# Patient Record
Sex: Male | Born: 1985
Health system: Southern US, Community
[De-identification: ages and names within clinical notes are randomized; demographics above are authoritative.]

## PROBLEM LIST (undated history)

## (undated) DIAGNOSIS — F329 Major depressive disorder, single episode, unspecified: Secondary | ICD-10-CM

## (undated) DIAGNOSIS — F32A Depression, unspecified: Secondary | ICD-10-CM

## (undated) DIAGNOSIS — K219 Gastro-esophageal reflux disease without esophagitis: Secondary | ICD-10-CM

## (undated) DIAGNOSIS — F419 Anxiety disorder, unspecified: Secondary | ICD-10-CM

## (undated) DIAGNOSIS — G709 Myoneural disorder, unspecified: Secondary | ICD-10-CM

## (undated) DIAGNOSIS — M109 Gout, unspecified: Secondary | ICD-10-CM

## (undated) HISTORY — DX: Anxiety disorder, unspecified: F41.9

## (undated) HISTORY — PX: SMALL INTESTINE SURGERY: SHX150

## (undated) HISTORY — DX: Gout, unspecified: M10.9

## (undated) HISTORY — DX: Depression, unspecified: F32.A

## (undated) HISTORY — DX: Myoneural disorder, unspecified: G70.9

---
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
## 2013-12-01 ENCOUNTER — Emergency Department: Payer: Self-pay | Admitting: Emergency Medicine

## 2017-10-13 ENCOUNTER — Other Ambulatory Visit: Payer: Self-pay

## 2017-10-13 ENCOUNTER — Emergency Department
Admission: EM | Admit: 2017-10-13 | Discharge: 2017-10-13 | Disposition: A | Discharge status: Home / Self Care | Payer: Self-pay | Attending: Emergency Medicine | Admitting: Emergency Medicine

## 2017-10-13 ENCOUNTER — Emergency Department: Payer: Self-pay

## 2017-10-13 ENCOUNTER — Encounter: Payer: Self-pay | Admitting: Emergency Medicine

## 2017-10-13 DIAGNOSIS — Z5321 Procedure and treatment not carried out due to patient leaving prior to being seen by health care provider: Secondary | ICD-10-CM | POA: Insufficient documentation

## 2017-10-13 DIAGNOSIS — R079 Chest pain, unspecified: Secondary | ICD-10-CM | POA: Insufficient documentation

## 2017-10-13 LAB — BASIC METABOLIC PANEL
Anion gap: 6 (ref 5–15)
BUN: 10 mg/dL (ref 6–20)
CALCIUM: 9.2 mg/dL (ref 8.9–10.3)
CO2: 29 mmol/L (ref 22–32)
CREATININE: 1.4 mg/dL — AB (ref 0.61–1.24)
Chloride: 103 mmol/L (ref 98–111)
GFR calc non Af Amer: >60 mL/min (ref >60)
Glucose, Bld: 86 mg/dL (ref 70–99)
Potassium: 4.2 mmol/L (ref 3.5–5.1)
SODIUM: 138 mmol/L (ref 135–145)

## 2017-10-13 LAB — CBC
HCT: 49.3 % (ref 40.0–52.0)
Hemoglobin: 17.1 g/dL (ref 13.0–18.0)
MCH: 29.4 pg (ref 26.0–34.0)
MCHC: 34.6 g/dL (ref 32.0–36.0)
MCV: 85 fL (ref 80.0–100.0)
PLATELETS: 258 10*3/uL (ref 150–440)
RBC: 5.8 MIL/uL (ref 4.40–5.90)
RDW: 13.4 % (ref 11.5–14.5)
WBC: 8.9 10*3/uL (ref 3.8–10.6)

## 2017-10-13 LAB — TROPONIN I

## 2017-10-13 NOTE — ED Notes (Signed)
Patient states his boss is texting him and wants him to go back to work.  Patient states he is going to leave.  Patient encouraged to return to the ED or go to a PCP or urgent care if symptoms continue or get worse.  Patient given booklet of [place] reduced rate medical resources.  Patient verbalized understanding.

## 2017-10-13 NOTE — ED Triage Notes (Signed)
Pt to ED via POV c/o chest pain x 3-4 days. Pt states that the pain is located in the center of his chest and is non-radiating. Pt states that he has had some shortness of breath. Pt denies any other symptoms. Pt is in NAD at this time.

## 2017-10-14 ENCOUNTER — Other Ambulatory Visit: Payer: Self-pay

## 2017-10-14 ENCOUNTER — Emergency Department: Payer: Self-pay

## 2017-10-14 ENCOUNTER — Encounter: Payer: Self-pay | Admitting: Emergency Medicine

## 2017-10-14 DIAGNOSIS — F17200 Nicotine dependence, unspecified, uncomplicated: Secondary | ICD-10-CM | POA: Insufficient documentation

## 2017-10-14 DIAGNOSIS — K219 Gastro-esophageal reflux disease without esophagitis: Secondary | ICD-10-CM | POA: Insufficient documentation

## 2017-10-14 LAB — CBC
HEMATOCRIT: 46.6 % (ref 40.0–52.0)
HEMOGLOBIN: 16.1 g/dL (ref 13.0–18.0)
MCH: 29.4 pg (ref 26.0–34.0)
MCHC: 34.5 g/dL (ref 32.0–36.0)
MCV: 85.1 fL (ref 80.0–100.0)
Platelets: 235 10*3/uL (ref 150–440)
RBC: 5.47 MIL/uL (ref 4.40–5.90)
RDW: 13.1 % (ref 11.5–14.5)
WBC: 8.5 10*3/uL (ref 3.8–10.6)

## 2017-10-14 LAB — BASIC METABOLIC PANEL
ANION GAP: 4 — AB (ref 5–15)
BUN: 13 mg/dL (ref 6–20)
CO2: 29 mmol/L (ref 22–32)
Calcium: 9 mg/dL (ref 8.9–10.3)
Chloride: 102 mmol/L (ref 98–111)
Creatinine, Ser: 1.6 mg/dL — ABNORMAL HIGH (ref 0.61–1.24)
GFR calc Af Amer: >60 mL/min (ref >60)
GFR calc non Af Amer: 56 mL/min — ABNORMAL LOW (ref >60)
GLUCOSE: 160 mg/dL — AB (ref 70–99)
Potassium: 3.5 mmol/L (ref 3.5–5.1)
Sodium: 135 mmol/L (ref 135–145)

## 2017-10-14 LAB — TROPONIN I: Troponin I: <0.03 ng/mL (ref <0.03)

## 2017-10-14 NOTE — ED Triage Notes (Addendum)
Patient with complaint of central chest pain times 4-5 days. Patient states that the pain is constant but that it hurts worse with eating or burping. Patient also with complaint of rash to left ac times 6 months.

## 2017-10-15 ENCOUNTER — Emergency Department
Admission: EM | Admit: 2017-10-15 | Discharge: 2017-10-15 | Disposition: A | Discharge status: Home / Self Care | Payer: Self-pay | Attending: Emergency Medicine | Admitting: Emergency Medicine

## 2017-10-15 DIAGNOSIS — K219 Gastro-esophageal reflux disease without esophagitis: Secondary | ICD-10-CM

## 2017-10-15 MED ORDER — PANTOPRAZOLE SODIUM 40 MG PO TBEC
DELAYED_RELEASE_TABLET | ORAL | Status: AC
  Filled 2017-10-15: qty 1

## 2017-10-15 MED ORDER — GI COCKTAIL ~~LOC~~
30.0000 mL | Freq: Once | ORAL | Status: AC
  Administered 2017-10-15: 30 mL via ORAL
  Filled 2017-10-15: qty 30

## 2017-10-15 MED ORDER — PANTOPRAZOLE SODIUM 40 MG PO TBEC: 40.0000 mg | DELAYED_RELEASE_TABLET | Freq: Every day | ORAL | 0 refills | Status: DC

## 2017-10-15 MED ORDER — PANTOPRAZOLE SODIUM 40 MG PO TBEC
40.0000 mg | DELAYED_RELEASE_TABLET | Freq: Every day | ORAL | Status: DC
  Administered 2017-10-15: 40 mg via ORAL

## 2017-10-15 NOTE — ED Provider Notes (Signed)
[facility] Emergency Department Provider Note    First MD Initiated Contact with Patient 10/15/17 [phone]     (approximate)  I have reviewed the triage vital signs and the nursing notes.   HISTORY  Chief Complaint Chest Pain    HPI Cody Delgado is a 32 y.o. male presents to the emergency department with 5-day history of epigastric/mid chest discomfort with eating.  Patient denies any fever.  Patient denies any dyspnea or cough.  Patient denies any lower extremity pain or swelling no history of DVT or PE.   Past medical history None There are no active problems to display for this patient.   Past surgical history None  Prior to Admission medications   Not on File    Allergies No known drug allergies No family history on file.  Social History Social History   Tobacco Use  . Smoking status: Current Every Day Smoker  . Smokeless tobacco: Never Used  Substance Use Topics  . Alcohol use: Yes    Alcohol/week: 20.0 standard drinks    Types: 20 Cans of beer per week    Frequency: Never    Comment: 20 beers per week  . Drug use: Yes    Types: Marijuana    Review of Systems Constitutional: No fever/chills Eyes: No visual changes. ENT: No sore throat. Cardiovascular: Positive for chest pain. Respiratory: Denies shortness of breath. Gastrointestinal: No abdominal pain.  No nausea, no vomiting.  No diarrhea.  No constipation. Genitourinary: Negative for dysuria. Musculoskeletal: Negative for neck pain.  Negative for back pain. Integumentary: Negative for rash. Neurological: Negative for headaches, focal weakness or numbness.   ____________________________________________   PHYSICAL EXAM:  VITAL SIGNS: ED Triage Vitals  Enc Vitals Group     BP 10/14/17 2304 (!) 146/101     Pulse Rate 10/14/17 2304 93     Resp 10/14/17 2304 18     Temp 10/14/17 2304 98.5 F (36.9 C)     Temp Source 10/14/17 2304 Oral     SpO2 10/14/17 2304  99 %     Weight 10/14/17 2305 104.3 kg (230 lb)     Height 10/14/17 2305 1.854 m (6\' 1" )     Head Circumference --      Peak Flow --      Pain Score 10/14/17 2305 2     Pain Loc --      Pain Edu? --      Excl. in GC? --     Constitutional: Alert and oriented. Well appearing and in no acute distress. Eyes: Conjunctivae are normal.  Head: Atraumatic. Mouth/Throat: Mucous membranes are moist.  Oropharynx non-erythematous. Neck: No stridor.   Cardiovascular: Normal rate, regular rhythm. Good peripheral circulation. Grossly normal heart sounds. Respiratory: Normal respiratory effort.  No retractions. Lungs CTAB. Gastrointestinal: Soft and nontender. No distention.  Musculoskeletal: No lower extremity tenderness nor edema. No gross deformities of extremities. Neurologic:  Normal speech and language. No gross focal neurologic deficits are appreciated.  Skin:  Skin is warm, dry and intact. No rash noted. Psychiatric: Mood and affect are normal. Speech and behavior are normal.   LABS (all labs ordered are listed, but only abnormal results are displayed)  Labs Reviewed  BASIC METABOLIC PANEL - Abnormal; Notable for the following components:      Result Value   Glucose, Bld 160 (*)    Creatinine, Ser 1.60 (*)    GFR calc non Af Amer 56 (*)    Anion  gap 4 (*)    All other components within normal limits  CBC  TROPONIN I   ____________________________________________  EKG  ED ECG REPORT I, Phil Campbell N BROWN, the attending physician, personally viewed and interpreted this ECG.   Date: 10/15/2017  EKG Time: 10:55 PM  Rate: 78  Rhythm: Normal sinus rhythm  Axis: Normal  Intervals: Normal  ST&T Change: None  ____________________________________________  RADIOLOGY I, Jennings N BROWN, personally viewed and evaluated these images (plain radiographs) as part of my medical decision making, as well as reviewing the written report by the radiologist.  ED MD interpretation:     Official radiology report(s): Dg Chest 2 View  Result Date: 10/14/2017 CLINICAL DATA:  Central chest pain for 4-5 days, constant, worse with eating. EXAM: CHEST - 2 VIEW COMPARISON:  Chest x-ray dated 10/13/2017. FINDINGS: The heart size and mediastinal contours are within normal limits. Both lungs are clear. The visualized skeletal structures are unremarkable. IMPRESSION: Stable chest x-ray.  No active cardiopulmonary disease. Electronically Signed   By: Bary Richard M.D.   On: 10/14/2017 23:39      Procedures   ____________________________________________   INITIAL IMPRESSION / ASSESSMENT AND PLAN / ED COURSE  As part of my medical decision making, I reviewed the following data within the electronic MEDICAL RECORD NUMBER  32 year old male presented with above-stated history and physical exam secondary to midline chest and epigastric discomfort.History and physical exam consistent with GERD however consider the possibility of CAD and as such EKG was performed which revealed no evidence of ischemia or infarction.  Laboratory data unremarkable including troponin.  Patient given GI cocktail and Protonix in the emergency department with complete resolution of discomfort.  ____________________________________________  FINAL CLINICAL IMPRESSION(S) / ED DIAGNOSES  Final diagnoses:  Gastroesophageal reflux disease, esophagitis presence not specified     MEDICATIONS GIVEN DURING THIS VISIT:  Medications  gi cocktail (Maalox,Lidocaine,Donnatal) (has no administration in time range)  pantoprazole (PROTONIX) EC tablet 40 mg (has no administration in time range)     ED Discharge Orders    None       Note:  This document was prepared using Dragon voice recognition software and may include unintentional dictation errors.    Darci Current, MD 10/15/17 0230

## 2017-10-15 NOTE — ED Notes (Signed)
Pt updated on the wait time and thanked for his patience; understands he will be next for treatment room

## 2017-10-15 NOTE — ED Notes (Signed)
Pt waiting patiently for treatment room; provided with warm blanket 

## 2017-11-11 ENCOUNTER — Other Ambulatory Visit: Payer: Self-pay

## 2017-11-11 ENCOUNTER — Encounter: Payer: Self-pay | Admitting: Emergency Medicine

## 2017-11-11 DIAGNOSIS — J029 Acute pharyngitis, unspecified: Secondary | ICD-10-CM | POA: Insufficient documentation

## 2017-11-11 DIAGNOSIS — Z5321 Procedure and treatment not carried out due to patient leaving prior to being seen by health care provider: Secondary | ICD-10-CM | POA: Insufficient documentation

## 2017-11-11 DIAGNOSIS — R0981 Nasal congestion: Secondary | ICD-10-CM | POA: Insufficient documentation

## 2017-11-11 NOTE — ED Triage Notes (Signed)
Pt in via POV with complaints of sore throat and congestion x 3 days, taking mucinex without any relief.  Pt denies any fevers.  Vitals WDL, NAD noted at this time.

## 2017-11-12 ENCOUNTER — Emergency Department
Admission: EM | Admit: 2017-11-12 | Discharge: 2017-11-12 | Disposition: A | Discharge status: Home / Self Care | Payer: Self-pay | Attending: Emergency Medicine | Admitting: Emergency Medicine

## 2017-11-12 HISTORY — DX: Gastro-esophageal reflux disease without esophagitis: K21.9

## 2017-11-12 LAB — GROUP A STREP BY PCR: GROUP A STREP BY PCR: NOT DETECTED

## 2018-06-24 ENCOUNTER — Other Ambulatory Visit: Payer: Self-pay

## 2018-06-24 ENCOUNTER — Emergency Department: Payer: Medicaid Other

## 2018-06-24 ENCOUNTER — Encounter: Payer: Self-pay | Admitting: Emergency Medicine

## 2018-06-24 DIAGNOSIS — M79671 Pain in right foot: Secondary | ICD-10-CM | POA: Diagnosis present

## 2018-06-24 DIAGNOSIS — F17210 Nicotine dependence, cigarettes, uncomplicated: Secondary | ICD-10-CM | POA: Diagnosis not present

## 2018-06-24 DIAGNOSIS — M109 Gout, unspecified: Secondary | ICD-10-CM | POA: Diagnosis not present

## 2018-06-24 LAB — URIC ACID: Uric Acid, Serum: 10.5 mg/dL — ABNORMAL HIGH (ref 3.7–8.6)

## 2018-06-24 NOTE — ED Triage Notes (Addendum)
Patient ambulatory to triage with steady gait, without difficulty or distress noted; pt reports right foot pain with no known injury x 3-4 days; pt reports he is concerned that it may be gout; denies hx of such but st family hx of same; st pain/swelling began in great toe then spread to foot

## 2018-06-25 ENCOUNTER — Emergency Department
Admission: EM | Admit: 2018-06-25 | Discharge: 2018-06-25 | Disposition: A | Discharge status: Home / Self Care | Payer: Medicaid Other | Attending: Emergency Medicine | Admitting: Emergency Medicine

## 2018-06-25 DIAGNOSIS — M109 Gout, unspecified: Secondary | ICD-10-CM

## 2018-06-25 MED ORDER — NAPROXEN 500 MG PO TABS: 500.0000 mg | ORAL_TABLET | Freq: Two times a day (BID) | ORAL | 0 refills | Status: AC | PRN

## 2018-06-25 MED ORDER — INDOMETHACIN 50 MG PO CAPS
50.0000 mg | ORAL_CAPSULE | Freq: Once | ORAL | Status: AC
  Administered 2018-06-25: 50 mg via ORAL
  Filled 2018-06-25: qty 1

## 2018-06-25 MED ORDER — COLCHICINE 0.6 MG PO TABS
0.6000 mg | ORAL_TABLET | Freq: Once | ORAL | Status: AC
  Administered 2018-06-25: 0.6 mg via ORAL
  Filled 2018-06-25: qty 1

## 2018-06-25 NOTE — ED Provider Notes (Signed)
[facility] Emergency Department Provider Note ____________________________   First MD Initiated Contact with Patient 06/25/18 [phone]     (approximate)  I have reviewed the triage vital signs and the nursing notes.   HISTORY  Chief Complaint Foot Pain    HPI Cody Delgado is a 33 y.o. male presents to the emergency department with nontraumatic right great toe foot ankle pain times approximately 3 days.  Patient denies any fever.  Patient denies any other complaints.  Patient states strong family history of gout.     Past Medical History:  Diagnosis Date   Acid reflux     There are no active problems to display for this patient.   History reviewed. No pertinent surgical history.  Prior to Admission medications   Medication Sig Start Date End Date Taking? Authorizing Provider  naproxen (NAPROSYN) 500 MG tablet Take 1 tablet (500 mg total) by mouth 2 (two) times daily as needed. 06/25/18 06/25/19  Darci Current, MD  pantoprazole (PROTONIX) 40 MG tablet Take 1 tablet (40 mg total) by mouth daily. 10/15/17 11/14/17  Darci Current, MD    Allergies Patient has no known allergies.  No family history on file.  Social History Social History   Tobacco Use   Smoking status: Current Every Day Smoker    Packs/day: 1.00    Types: Cigarettes   Smokeless tobacco: Never Used  Substance Use Topics   Alcohol use: Yes    Alcohol/week: 20.0 standard drinks    Types: 20 Cans of beer per week    Frequency: Never    Comment: 20 beers per week   Drug use: Yes    Types: Marijuana    Review of Systems Constitutional: No fever/chills Eyes: No visual changes. ENT: No sore throat. Cardiovascular: Denies chest pain. Respiratory: Denies shortness of breath. Gastrointestinal: No abdominal pain.  No nausea, no vomiting.  No diarrhea.  No constipation. Genitourinary: Negative for dysuria. Musculoskeletal: Negative for neck pain.  Negative for  back pain.  Positive for right foot pain. Integumentary: Negative for rash. Neurological: Negative for headaches, focal weakness or numbness.  ____________________________________________   PHYSICAL EXAM:  VITAL SIGNS: ED Triage Vitals  Enc Vitals Group     BP 06/24/18 2326 (!) 129/91     Pulse Rate 06/24/18 2326 88     Resp 06/24/18 2326 20     Temp 06/24/18 2326 98.4 F (36.9 C)     Temp Source 06/24/18 2326 Oral     SpO2 06/24/18 2326 98 %     Weight 06/24/18 2301 104.3 kg (230 lb)     Height 06/24/18 2301 1.829 m (6')     Head Circumference --      Peak Flow --      Pain Score 06/24/18 2301 8     Pain Loc --      Pain Edu? --      Excl. in GC? --     Constitutional: Alert and oriented. Well appearing and in no acute distress. Eyes: Conjunctivae are normal. Mouth/Throat: Mucous membranes are moist.  Oropharynx non-erythematous. Neck: No stridor.   Cardiovascular: Normal rate, regular rhythm. Good peripheral circulation. Grossly normal heart sounds. Respiratory: Normal respiratory effort.  No retractions. No audible wheezing. Gastrointestinal: Soft and nontender. No distention.  Musculoskeletal: No lower extremity tenderness nor edema. No gross deformities of extremities. Neurologic:  Normal speech and language. No gross focal neurologic deficits are appreciated.  Skin:  Skin is warm, dry and  intact. No rash noted. Psychiatric: Mood and affect are normal. Speech and behavior are normal.  ____________________________________________   LABS (all labs ordered are listed, but only abnormal results are displayed)  Labs Reviewed  URIC ACID - Abnormal; Notable for the following components:      Result Value   Uric Acid, Serum 10.5 (*)    All other components within normal limits   ___________________________________ RADIOLOGY I, [place] N Aidin Doane, personally viewed and evaluated these images (plain radiographs) as part of my medical decision making, as well as  reviewing the written report by the radiologist.  ED MD interpretation: The right foot x-ray per radiologist.  Official radiology report(s): Dg Foot Complete Right  Result Date: 06/24/2018 CLINICAL DATA:  Right foot pain.  No known injury. EXAM: RIGHT FOOT COMPLETE - 3+ VIEW COMPARISON:  None. FINDINGS: There is no evidence of fracture or dislocation. There is no evidence of arthropathy or other focal bone abnormality. Soft tissues are unremarkable. IMPRESSION: Negative. Electronically Signed   By: Charlett NoseKevin  Dover M.D.   On: 06/24/2018 23:26      Procedures   ____________________________________________   INITIAL IMPRESSION / MDM / ASSESSMENT AND PLAN / ED COURSE  As part of my medical decision making, I reviewed the following data within the electronic MEDICAL RECORD NUMBER   33 year old male presenting with above-stated history and physical exam concerning for possible gout.  Patient's uric acid level noted to be elevated at 10.5.  X-ray revealed no acute findings.  Patient given indomethacin and colchicine in the emergency department with pain provement.  Patient will be prescribed naproxen for home        ____________________________________________  FINAL CLINICAL IMPRESSION(S) / ED DIAGNOSES  Final diagnoses:  Acute gout involving toe of right foot, unspecified cause     MEDICATIONS GIVEN DURING THIS VISIT:  Medications  colchicine tablet 0.6 mg (has no administration in time range)  indomethacin (INDOCIN) capsule 50 mg (has no administration in time range)     ED Discharge Orders         Ordered    naproxen (NAPROSYN) 500 MG tablet  2 times daily PRN     06/25/18 0155           Note:  This document was prepared using Dragon voice recognition software and may include unintentional dictation errors.   Darci CurrentBrown, Kiln N, MD 06/25/18 [phone]

## 2019-09-02 ENCOUNTER — Telehealth: Payer: Self-pay

## 2019-09-02 ENCOUNTER — Ambulatory Visit
Admission: RE | Admit: 2019-09-02 | Discharge: 2019-09-02 | Disposition: A | Discharge status: Home / Self Care | Payer: Medicaid Other | Attending: Adult Health | Admitting: Adult Health

## 2019-09-02 ENCOUNTER — Ambulatory Visit (INDEPENDENT_AMBULATORY_CARE_PROVIDER_SITE_OTHER): Payer: Medicaid Other | Admitting: Adult Health

## 2019-09-02 ENCOUNTER — Other Ambulatory Visit: Payer: Self-pay

## 2019-09-02 ENCOUNTER — Encounter: Payer: Self-pay | Admitting: Adult Health

## 2019-09-02 ENCOUNTER — Other Ambulatory Visit: Payer: Self-pay | Admitting: Adult Health

## 2019-09-02 ENCOUNTER — Ambulatory Visit
Admission: RE | Admit: 2019-09-02 | Discharge: 2019-09-02 | Disposition: A | Discharge status: Home / Self Care | Payer: Medicaid Other | Source: Ambulatory Visit | Attending: Adult Health | Admitting: Adult Health

## 2019-09-02 VITALS — BP 124/72 | HR 71 | Temp 96.8°F | Resp 16 | Wt 228.2 lb

## 2019-09-02 DIAGNOSIS — K5900 Constipation, unspecified: Secondary | ICD-10-CM

## 2019-09-02 DIAGNOSIS — F329 Major depressive disorder, single episode, unspecified: Secondary | ICD-10-CM

## 2019-09-02 DIAGNOSIS — Z13220 Encounter for screening for lipoid disorders: Secondary | ICD-10-CM | POA: Diagnosis not present

## 2019-09-02 DIAGNOSIS — K219 Gastro-esophageal reflux disease without esophagitis: Secondary | ICD-10-CM

## 2019-09-02 DIAGNOSIS — Z114 Encounter for screening for human immunodeficiency virus [HIV]: Secondary | ICD-10-CM | POA: Insufficient documentation

## 2019-09-02 DIAGNOSIS — Z0000 Encounter for general adult medical examination without abnormal findings: Secondary | ICD-10-CM

## 2019-09-02 DIAGNOSIS — F419 Anxiety disorder, unspecified: Secondary | ICD-10-CM | POA: Diagnosis not present

## 2019-09-02 DIAGNOSIS — F1990 Other psychoactive substance use, unspecified, uncomplicated: Secondary | ICD-10-CM | POA: Insufficient documentation

## 2019-09-02 DIAGNOSIS — F32A Depression, unspecified: Secondary | ICD-10-CM | POA: Insufficient documentation

## 2019-09-02 DIAGNOSIS — Z1389 Encounter for screening for other disorder: Secondary | ICD-10-CM

## 2019-09-02 DIAGNOSIS — R45851 Suicidal ideations: Secondary | ICD-10-CM

## 2019-09-02 MED ORDER — POLYETHYLENE GLYCOL 3350 17 GM/SCOOP PO POWD: 17.0000 g | Freq: Every day | ORAL | 0 refills | Status: DC

## 2019-09-02 MED ORDER — PANTOPRAZOLE SODIUM 40 MG PO TBEC: 40.0000 mg | DELAYED_RELEASE_TABLET | Freq: Every day | ORAL | 0 refills | Status: DC

## 2019-09-02 MED ORDER — METAMUCIL SMOOTH TEXTURE 58.6 % PO POWD: 1.0000 | Freq: Three times a day (TID) | ORAL | 12 refills | Status: DC

## 2019-09-02 NOTE — Telephone Encounter (Signed)
Left detailed message letting wife know labs hours of operation. KW

## 2019-09-02 NOTE — Progress Notes (Signed)
No significant stool burden seen, keep plan and recommend discontinue drug use. Will send to GI as well if he is in agreement. Restart Protonix sent to pharmacy.

## 2019-09-02 NOTE — Telephone Encounter (Signed)
Copied from CRM [id number]. Topic: General - Inquiry >> Sep 02, 2019 12:46 PM Daphine Deutscher D wrote: Reason for CRM: Pt's wife called regarding her husbands insurance information for his visit today.  He had medicaid but it was assigned a different doctor than who seen him.  He was seen and then when they got home they called and changed the medicaid but it does not start until aug 1.  He still needs to get his labs done and she wants to know when he can get that done  CB#  [phone]

## 2019-09-02 NOTE — Addendum Note (Signed)
Addended by: Berniece Pap on: 09/02/2019 04:43 PM   Modules accepted: Orders

## 2019-09-02 NOTE — Progress Notes (Signed)
Chart merger was placed on schedule in error by scheduler and Sheliah Hatch cma PLACED LAB ORDERS FOR PROVIDER AND PROVIDER DELETED. Correct chart charted in.  No contact

## 2019-09-02 NOTE — Patient Instructions (Signed)

## 2019-09-02 NOTE — Progress Notes (Signed)
New patient visit   Patient: Cody Delgado   DOB: [birth year]   34 y.o. Male  MRN: [id number] Visit Date: 09/02/2019  Today's healthcare provider: Jairo Ben, FNP   Chief Complaint  Patient presents with  . New Patient (Initial Visit)   Subjective    Cody Delgado is a 34 y.o. male who presents today as a new patient to establish care.  HPI  Patient reports today that he is feeling fairly well but would like to address concern of constipation which he has been suffering with for the past five months.    Patient reports that he has only been passing one small bowel movement a week, he reports upper abdominal pain and bloating, he reports he has not been taking his GERD medicine, he also reports that his bowel movement is about a third of what it used to be.  He denies any dark tarry stools or bloody stools.  Denies any rectal pain.  Does have a history of hemorrhoids that were banded in the previous at gastroenterology.  He said it was painful and he was told he might need a surgical consult, however he denies any persistent hemorrhoids or pain in his rectal area.   Patient states that he has tried taking otc, stool softener, laxatives and metamucil and not taken any of them more than one day. Not drinking much water during the day.   Patient reports that he follows a well balanced diet and has included more fiber in his diet, patient states that he is not actively exercising but is staying active by walking on the job. On average patient reports that he sleeps 5-6hrs a night.   Denies any gastric cancer in family.   Drinks beer 7 cans nightly.  He smokes cigarettes 1 ppd, vape's  nicotine  occasional use.  Occasional marijuana use. Also uses cocaine recently. Denies any other drug use.   He was previously seen at [facility] ago.  History of GERD in past and reports took medication but this has since resolved.  February had Covid 2021.  He has  a history of anxiety and depression. He also has suicidal thoughts, but no plan or intent. He has had anxiety since age 43. Denies any previous suicide attempts. He has never been prescribed anything for anxiety- he says friends have given him xanax and klonopin and they worked okay but not great for him especially xanax. denies any other antidepressant use.   Patient  denies any fever, body aches,chills, rash, chest pain, shortness of breath, nausea, vomiting, or diarrhea.    Past Medical History:  Diagnosis Date  . Acid reflux   . Anxiety   . Depression   . Gout   . Neuromuscular disorder [facility])    Past Surgical History:  Procedure Laterality Date  . SMALL INTESTINE SURGERY     No family status information on file.   History reviewed. No pertinent family history. Social History   Socioeconomic History  . Marital status: Married    Spouse name: Not on file  . Number of children: Not on file  . Years of education: Not on file  . Highest education level: Not on file  Occupational History  . Not on file  Tobacco Use  . Smoking status: Current Every Day Smoker    Packs/day: 1.00    Types: Cigarettes  . Smokeless tobacco: Never Used  Vaping Use  . Vaping Use: Never used  Substance and  Sexual Activity  . Alcohol use: Yes    Alcohol/week: 25.0 standard drinks    Types: 25 Cans of beer per week    Comment: 20 beers per week  . Drug use: Yes    Types: Marijuana  . Sexual activity: Yes  Other Topics Concern  . Not on file  Social History Narrative  . Not on file   Social Determinants of Health   Financial Resource Strain:   . Difficulty of Paying Living Expenses:   Food Insecurity:   . Worried About Programme researcher, broadcasting/film/video in the Last Year:   . Barista in the Last Year:   Transportation Needs:   . Freight forwarder (Medical):   Marland Kitchen Lack of Transportation (Non-Medical):   Physical Activity:   . Days of Exercise per Week:   . Minutes of Exercise per Session:     Stress:   . Feeling of Stress :   Social Connections:   . Frequency of Communication with Friends and Family:   . Frequency of Social Gatherings with Friends and Family:   . Attends Religious Services:   . Active Member of Clubs or Organizations:   . Attends Banker Meetings:   Marland Kitchen Marital Status:    Outpatient Medications Prior to Visit  Medication Sig  . [DISCONTINUED] pantoprazole (PROTONIX) 40 MG tablet Take 1 tablet (40 mg total) by mouth daily.   No facility-administered medications prior to visit.   No Known Allergies   There is no immunization history on file for this patient.  Health Maintenance  Topic Date Due  . Hepatitis C Screening  Never done  . COVID-19 Vaccine (1) Never done  . HIV Screening  Never done  . TETANUS/TDAP  Never done  . INFLUENZA VACCINE  09/21/2019    Patient Care Team: [facility] as PCP - General (General Practice)  Review of Systems  Constitutional: Positive for unexpected weight change.  HENT: Positive for dental problem and voice change.   Gastrointestinal: Positive for abdominal distention, abdominal pain, constipation and diarrhea.  Endocrine: Positive for heat intolerance and polyuria.  Genitourinary: Positive for frequency.  Musculoskeletal: Positive for arthralgias and myalgias.  Psychiatric/Behavioral: Positive for agitation, confusion, decreased concentration and sleep disturbance. The patient is nervous/anxious.   All other systems reviewed and are negative.   Last CBC Lab Results  Component Value Date   WBC 8.5 10/14/2017   HGB 16.1 10/14/2017   HCT 46.6 10/14/2017   MCV 85.1 10/14/2017   MCH 29.4 10/14/2017   RDW 13.1 10/14/2017   PLT 235 10/14/2017   Last metabolic panel Lab Results  Component Value Date   GLUCOSE 160 (H) 10/14/2017   NA 135 10/14/2017   K 3.5 10/14/2017   CL 102 10/14/2017   CO2 29 10/14/2017   BUN 13 10/14/2017   CREATININE 1.60 (H) 10/14/2017    GFRNONAA 56 (L) 10/14/2017   GFRAA >60 10/14/2017   CALCIUM 9.0 10/14/2017   ANIONGAP 4 (L) 10/14/2017      Objective    BP 124/72   Pulse 71   Temp (!) 96.8 F (36 C) (Oral)   Resp 16   Wt 228 lb 3.2 oz (103.5 kg)   SpO2 99%   BMI 30.95 kg/m  Physical Exam Vitals and nursing note reviewed.  Constitutional:      General: He is not in acute distress.    Appearance: Normal appearance. He is well-developed. He is not ill-appearing, toxic-appearing  or diaphoretic.     Comments: Patient is alert and oriented and responsive to questions Engages in eye contact with provider. Speaks in full sentences without any pauses without any shortness of breath or distress.    HENT:     Head: Normocephalic and atraumatic.     Right Ear: Hearing, tympanic membrane, ear canal and external ear normal.     Left Ear: Hearing, tympanic membrane, ear canal and external ear normal.     Nose: Nose normal.     Mouth/Throat:     Pharynx: Uvula midline. No oropharyngeal exudate.  Eyes:     General: Lids are normal. No scleral icterus.       Right eye: No discharge.        Left eye: No discharge.     Conjunctiva/sclera: Conjunctivae normal.     Pupils: Pupils are equal, round, and reactive to light.  Neck:     Thyroid: No thyromegaly.     Vascular: Normal carotid pulses. No carotid bruit, hepatojugular reflux or JVD.     Trachea: Trachea and phonation normal. No tracheal tenderness or tracheal deviation.     Meningeal: Brudzinski's sign absent.  Cardiovascular:     Rate and Rhythm: Normal rate and regular rhythm.     Pulses: Normal pulses.     Heart sounds: Normal heart sounds, S1 normal and S2 normal. Heart sounds not distant. No murmur heard.  No friction rub. No gallop.   Pulmonary:     Effort: Pulmonary effort is normal. No accessory muscle usage or respiratory distress.     Breath sounds: Normal breath sounds. No stridor. No wheezing, rhonchi or rales.  Chest:     Chest wall: No tenderness.    Abdominal:     General: Bowel sounds are normal. There is distension (mild throughout noted ).     Palpations: Abdomen is soft. There is no mass.     Tenderness: There is no abdominal tenderness. There is no right CVA tenderness, left CVA tenderness, guarding or rebound.     Hernia: No hernia is present.  Musculoskeletal:        General: No swelling, tenderness or deformity. Normal range of motion.     Cervical back: Full passive range of motion without pain, normal range of motion and neck supple.     Comments: Patient moves on and off of exam table and in room without difficulty. Gait is normal in hall and in room. Patient is oriented to person place time and situation. Patient answers questions appropriately and engages in conversation.   Lymphadenopathy:     Head:     Right side of head: No submental, submandibular, tonsillar, preauricular, posterior auricular or occipital adenopathy.     Left side of head: No submental, submandibular, tonsillar, preauricular, posterior auricular or occipital adenopathy.     Cervical: No cervical adenopathy.  Skin:    General: Skin is warm and dry.     Capillary Refill: Capillary refill takes less than 2 seconds.     Coloration: Skin is not pale.     Findings: No erythema or rash.     Nails: There is no clubbing.  Neurological:     General: No focal deficit present.     Mental Status: He is alert and oriented to person, place, and time.     GCS: GCS eye subscore is 4. GCS verbal subscore is 5. GCS motor subscore is 6.     Cranial Nerves: No cranial nerve deficit.  Sensory: No sensory deficit.     Motor: No weakness or abnormal muscle tone.     Coordination: Coordination normal.     Gait: Gait normal.     Deep Tendon Reflexes: Reflexes are normal and symmetric. Reflexes normal.  Psychiatric:        Mood and Affect: Mood normal.        Speech: Speech normal.        Behavior: Behavior normal.        Thought Content: Thought content normal.         Judgment: Judgment normal.      Depression Screen PHQ 2/9 Scores 09/02/2019  PHQ - 2 Score 6  PHQ- 9 Score 22   No results found for any visits on 09/02/19.  Assessment & Plan     Constipation, unspecified constipation type - Plan: Comprehensive metabolic panel, CBC with Differential/Platelet, TSH, Lipid panel, DG Abd 1 View  Screening cholesterol level  Anxiety - Plan: Ambulatory referral to Psychiatry  Depression, unspecified depression type - Plan: Ambulatory referral to Psychiatry  Verbalizes suicidal thoughts - Plan: Ambulatory referral to Psychiatry  Routine health maintenance  Drug use  Gastroesophageal reflux disease, unspecified whether esophagitis present - Plan: pantoprazole (PROTONIX) 40 MG tablet   Meds ordered this encounter  Medications  . pantoprazole (PROTONIX) 40 MG tablet    Sig: Take 1 tablet (40 mg total) by mouth daily.    Dispense:  30 tablet    Refill:  0  . polyethylene glycol powder (GLYCOLAX/MIRALAX) 17 GM/SCOOP powder    Sig: Take 17 g by mouth daily.    Dispense:  17 g    Refill:  0  . psyllium (METAMUCIL SMOOTH TEXTURE) 58.6 % powder    Sig: Take 1 packet by mouth 3 (three) times daily.    Dispense:  283 g    Refill:  12  medications as above  Orders Placed This Encounter  Procedures  . DG Abd 1 View  . Comprehensive metabolic panel  . CBC with Differential/Platelet  . TSH  . Lipid panel  . Ambulatory referral to Psychiatry    He would like to get a beautiful mind his wife goes to that practice and he says he would like to see them for psychiatric care he denies any suicidal plans, however he does report suicidal thoughts he thought this was normal for everyone.  He is told to call the office back if he has not heard from the office within 2 weeks.   Smoking cessation instruction/counseling given:  counseled patient on the dangers of tobacco use, advised patient to stop smoking, and reviewed strategies to maximize  success  Counseled  him on discontinuing use of marijuana and recently started cocaine use.resources avalible.  Red Flags discussed. The patient was given clear instructions to go to ER or return to medical center if any red flags develop, symptoms do not improve, worsen or new problems develop. They verbalized understanding.   Advised patient call the office or your primary care doctor for an appointment if no improvement within 72 hours or if any symptoms change or worsen at any time  Advised ER or urgent Care if after hours or on weekend. Call 911 for emergency symptoms at any time.Patinet verbalized understanding of all instructions given/reviewed and treatment plan and has no further questions or concerns at this time.      Return in about 2 weeks (around 09/16/2019), or if symptoms worsen or fail to improve, for at any  time for any worsening symptoms, Go to Emergency room/ urgent care if worse.     IBeverely Pace Chasity Outten, FNP, have reviewed all documentation for this visit. The documentation on 09/02/19 for the exam, diagnosis, procedures, and orders are all accurate and complete.    Jairo Ben, FNP  [facility] [phone] (phone) [phone] (fax)  [facility]

## 2019-09-02 NOTE — Patient Instructions (Addendum)
Substance Use Disorder and Mental Illness Substance use disorder is a condition in which a person is dependent on a substance, such as drugs or alcohol. A mental illness is a condition that occurs when someone experiences changes in mood, behavior, or thinking. Sometimes, these two conditions can occur at the same time (co-occurring disorders) and may be diagnosed together (dual diagnosis). What is the relationship between substance use disorder and mental illness? Substance use disorder and mental illness can share symptoms and can have similar causes, such as exposure to stress or changes in brain chemicals. The risk for developing both of these conditions can be passed from parent to child (inherited). People with mental illnesses sometimes use drugs to try and relieve symptoms, and this can lead to substance use disorder. Substance use disorders occur more often in people who have depression, schizophrenia, anxiety, or personality disorders. When some drugs are used regularly, they can cause people to have symptoms of mental illness. What are the signs or symptoms? Symptoms vary widely for substance use disorder and mental illness, especially because these conditions can be present at the same time. Signs of a substance use disorder may include:  Failure to meet responsibilities at home, work, or school.  Using substances in risky situation, such as while driving or using machinery.  Taking serious risks to get drugs or alcohol.  Spending less time on activities or hobbies that used to be important.  Changes in personality or attitude for no reason, such as angry outbursts, symptoms of anxiety, or unusual giddiness.  Trying to hide the amount of drugs or alcohol used.  Increased substance use over time, or needing to use more of a substance to feel the same effects (developing a tolerance).  Sudden weight loss or gain.  Sleeping too much or too little.  Uncontrolled trembling or shaking  (tremors), slurred speech, or lack of coordination.  Continuing to use alcohol or a drug even though using it has led to bad outcomes or consequences, such as losing a job or ending a relationship. Signs of mental illness may include:  Extreme mood changes (mood swings).  Sleeping too much or too little.  Weight loss or weight gain.  Being easily distracted.  Confused thinking or trouble concentrating.  Expressing thoughts of suicide.  Withdrawing from friends and family, or sudden changes in social behaviors or hobbies.  Aggression toward people and animals.  Repeatedly breaking serious rules or breaking the law.  Having persistent thoughts or urges that are unpleasant or feel out of control (involuntary). The person may feel the need to act on the urges in order to reduce anxiety.  Persistent feelings of sadness or hopelessness.  False beliefs (delusions).  Seeing, hearing, tasting, smelling, or feeling things that are not real (hallucinations). How is this diagnosed? Substance use disorder and mental illnesses can be difficult to diagnose at the same time because of how varied and complex the symptoms are. Because these conditions can interact, one condition may be missed. This is why it is important to be completely honest with your health care provider about substance use and your other symptoms. Diagnosing your condition may include:  A physical exam.  A review of your medical history and your symptoms. Your health care provider may refer you to a mental health professional for a psychiatric evaluation. This may include assessments of:  Your use of substances.  Your risk of suicide.  Your risk of aggressive behaviors.  Your lifestyle, environment, and social situations.  Your   medical health.  Your mental health and behavioral history. How is this treated? It is best to treat substance use disorder and mental illness at the same time (integrated treatment  approach). Treatment usually involves more than one of the following methods:  Detox. This refers to stopping substance abuse while being monitored by trained medical staff. This is usually the first step in treatment. Detox can last for up to 7 days.  Rehabilitation. This involves staying in a treatment center where you can have medical and mental health support all the time.  Medicines to relieve symptoms of mental illness and to control symptoms that are caused by stopping substance abuse (withdrawal symptoms).  Support groups. These groups encourage you to talk about your fears, frustrations, and anxieties with others who have the same condition.  Talk therapy. This is one-on-one therapy that can help you learn about your illness and learn ways to cope with symptoms or side effects. Follow these instructions at home: Lifestyle  Exercise regularly. Aim for 150 minutes of moderate exercise (such as walking or biking) or 75 minutes of vigorous exercise (such as running) each week.  Eat a healthy diet with plenty of fruits and vegetables, whole grains, and lean proteins.  Avoid caffeine and tobacco. These can worsen symptoms and anxiety.  Do not drink alcohol or use drugs.  Try to get 7-9 hours of sleep each night. To do this: ? Keep your bedroom cool and dark. ? Do not eat a heavy meal during the hour before you go to bed. ? Do not have caffeine before bedtime. ? Avoid screen time during the few hours before bedtime. This means not watching TV and not using a computer, cell phone, or tablet. Medicines  Take over-the-counter and prescription medicines only as told by your health care provider.  Do not stop taking medicines unless you ask your health care provider if it is safe to do that.  Tell your health care provider about any medicine side effects that you experience. General instructions  Follow your treatment plan as directed. Work with your health care provider to adjust  your treatment plan as needed.  Attend support group or therapy sessions as directed.  Explain your diagnosis to your friends and family. Let them know what your symptoms are and what things cause your symptoms to start (triggers).  Avoid triggers or stressors that may worsen your symptoms or cause you to use alcohol or drugs. Spend time with family and friends who do not use substances.  Make time to relax and do self-soothing activities, such as meditating or listening to music.  Keep all follow-up visits as told by your health care provider and therapist. This is important. Where to find support You may find support for coping with substance use disorder and mental illness from:  Your health care providers or your therapist. These providers can treat you or they can help you find services to treat your condition.  Local support groups for people with your condition. Your health care provider or therapist may be able to recommend a support group. This may be a hospital support group, a [employer] [facility] [place] support group, or a 12-step group such as Alcoholics Anonymous (AA) or Narcotics Anonymous (NA).  Family and friends. Let them know what they can do to best support you through your recovery process. Where to find more information You may find more information about substance use disorder and mental illness from:  Substance Abuse and Mental Health  Services Administration [facility](SAMHSA): ? Online: SkateOasis.com.ptwww.samhsa.gov ? [employer]pl[employer], to talk with a person who can help you find information about treatment services in your area: (716)489-19711-206-635-7465 (678)855-7919(1-877-SAMHSA7)  [place]. Department of Health and Health and safety inspectorHuman Services mental health services: https://www.vaughan-[place].com/www.mentalhealth.gov  National Alliance on Mental Illness (NAMI): www.nami.org  [employer] on Alcoholism and Drug Dependence: www.ncadd.org Contact a health care provider if:  Your symptoms get worse or they do not get better.  You have  negative side effects from taking medicines.  You want to discuss stopping medicines or treatment.  You start using a substance again (have a relapse). Get help right away if:  You have thoughts about harming yourself or others. If you ever feel like you may hurt yourself or others, or have thoughts about taking your own life, get help right away. You can go to your nearest emergency department or call:  Your local emergency services (911 in the [place]  A suicide crisis helpline, such as the National Suicide Prevention Lifeline at 60837359221-919-882-3201. This is open 24 hours a day. Summary  Substance use disorder and mental illness can occur at the same time (co-occurring disorders), and they may be diagnosed together (dual diagnosis).  These conditions can be difficult to diagnose at the same time. It is important to be completely honest with your health care provider about your substance use and your other symptoms.  It is best to treat substance use disorder and mental illness at the same time (integrated treatment approach).  Identifying new ways to deal with triggers and difficult situations is an important part of recovery.  Keep all follow-up visits as told by your health care provider and therapist. Be consistent when attending support groups or treatment programs. This information is not intended to replace advice given to you by your health care provider. Make sure you discuss any questions you have with your health care provider. Document Revised: 04/04/2018 Document Reviewed: 06/23/2016 Elsevier Patient Education  2020 Elsevier Inc. Psyllium granules or powder for solution What is this medicine? PSYLLIUM (SIL i yum) is a bulk-forming fiber laxative. This medicine is used to treat constipation. Increasing fiber in the diet may also help lower cholesterol and promote heart health for some people. This medicine may be used for other purposes; ask your health care provider or  pharmacist if you have questions. COMMON BRAND NAME(S): Fiber Therapy, GenFiber, Geri-Mucil, Hydrocil, Konsyl, Metamucil, Metamucil MultiHealth, Mucilin, Natural Fiber Therapy, Reguloid What should I tell my health care provider before I take this medicine? They need to know if you have any of these conditions:  blockage in your bowel  difficulty swallowing  inflammatory bowel disease  phenylketonuria  stomach or intestine problems  sudden change in bowel habits lasting more than 2 weeks  an unusual or allergic reaction to psyllium, other medicines, dyes, or preservatives  pregnant or trying or get pregnant  breast-feeding How should I use this medicine? Mix this medicine into a full glass (240 mL) of water or other cool drink. Take this medicine by mouth. Follow the directions on the package labeling, or take as directed by your health care professional. Take your medicine at regular intervals. Do not take your medicine more often than directed. Talk to your pediatrician regarding the use of this medicine in children. While this drug may be prescribed for children as young as 34 years old for selected conditions, precautions do apply. Overdosage: If you think you have taken too much of this medicine contact a poison control center or  emergency room at once. NOTE: This medicine is only for you. Do not share this medicine with others. What if I miss a dose? If you miss a dose, take it as soon as you can. If it is almost time for your next dose, take only that dose. Do not take double or extra doses. What may interact with this medicine? Interactions are not expected. Take this product at least 2 hours before or after other medicines. This list may not describe all possible interactions. Give your health care provider a list of all the medicines, herbs, non-prescription drugs, or dietary supplements you use. Also tell them if you smoke, drink alcohol, or use illegal drugs. Some items may  interact with your medicine. What should I watch for while using this medicine? Check with your doctor or health care professional if your symptoms do not start to get better or if they get worse. Stop using this medicine and contact your doctor or health care professional if you have rectal bleeding or if you have to treat your constipation for more than 1 week. These could be signs of a more serious condition. Drink several glasses of water a day while you are taking this medicine. This will help to relieve constipation and prevent dehydration. What side effects may I notice from receiving this medicine? Side effects that you should report to your doctor or health care professional as soon as possible:  allergic reactions like skin rash, itching or hives, swelling of the face, lips, or tongue  breathing problems  chest pain  nausea, vomiting  rectal bleeding  trouble swallowing Side effects that usually do not require medical attention (report to your doctor or health care professional if they continue or are bothersome):  bloating  gas  stomach cramps This list may not describe all possible side effects. Call your doctor for medical advice about side effects. You may report side effects to FDA at 1-800-FDA-1088. Where should I keep my medicine? Keep out of the reach of children. Store at room temperature between 15 and 30 degrees C (59 and 86 degrees F). Protect from moisture. Throw away any unused medicine after the expiration date. NOTE: This sheet is a summary. It may not cover all possible information. If you have questions about this medicine, talk to your doctor, pharmacist, or health care provider.  2020 Elsevier/Gold Standard (2017-07-03 15:41:08) Polyethylene Glycol powder What is this medicine? POLYETHYLENE GLYCOL 3350 (pol ee ETH i leen; GLYE col) powder is a laxative used to treat constipation. It increases the amount of water in the stool. Bowel movements become  easier and more frequent. This medicine may be used for other purposes; ask your health care provider or pharmacist if you have questions. COMMON BRAND NAME(S): Tish Men, GlycoLax, Healthylax, MiraLax, Smooth LAX, Vita Health What should I tell my health care provider before I take this medicine? They need to know if you have any of these conditions:  a history of blockage of the stomach or intestine  current abdomen distension or pain  difficulty swallowing  diverticulitis, ulcerative colitis, or other chronic bowel disease  phenylketonuria  an unusual or allergic reaction to polyethylene glycol, other medicines, dyes, or preservatives  pregnant or trying to get pregnant  breast-feeding How should I use this medicine? Take this medicine by mouth. The bottle has a measuring cap that is marked with a line. Pour the powder into the cap up to the marked line (the dose is about 1 heaping tablespoon). Add the  powder in the cap to a full glass (4 to 8 ounces or 120 to 240 mL) of water, juice, soda, coffee or tea. Mix the powder well. Ensure that the powder is fully dissolved. Do not drink if there are any clumps. Drink the solution. Take exactly as directed. Do not take your medicine more often than directed. Talk to your pediatrician regarding the use of this medicine in children. Special care may be needed. Overdosage: If you think you have taken too much of this medicine contact a poison control center or emergency room at once. NOTE: This medicine is only for you. Do not share this medicine with others. What if I miss a dose? If you miss a dose, take it as soon as you can. If it is almost time for your next dose, take only that dose. Do not take double or extra doses. What may interact with this medicine? Interactions are not expected. This list may not describe all possible interactions. Give your health care provider a list of all the medicines, herbs, non-prescription drugs, or  dietary supplements you use. Also tell them if you smoke, drink alcohol, or use illegal drugs. Some items may interact with your medicine. What should I watch for while using this medicine? Do not use for more than 2 weeks without advice from your doctor or health care professional. It can take 2 to 4 days to have a bowel movement and to experience improvement in constipation. See your health care professional for any changes in bowel habits, including constipation, that are severe or last longer than three weeks. Always take this medicine with plenty of water. What side effects may I notice from receiving this medicine? Side effects that you should report to your doctor or health care professional as soon as possible:  diarrhea  difficulty breathing  itching of the skin, hives, or skin rash  severe bloating, pain, or distension of the stomach  vomiting Side effects that usually do not require medical attention (report to your doctor or health care professional if they continue or are bothersome):  bloating or gas  lower abdominal discomfort or cramps  nausea This list may not describe all possible side effects. Call your doctor for medical advice about side effects. You may report side effects to FDA at 1-800-FDA-1088. Where should I keep my medicine? Keep out of the reach of children. Store between 15 and 30 degrees C (59 and 86 degrees F). Throw away any unused medicine after the expiration date. NOTE: This sheet is a summary. It may not cover all possible information. If you have questions about this medicine, talk to your doctor, pharmacist, or health care provider.  2020 Elsevier/Gold Standard (2017-07-26 10:42:01) Constipation, Adult Constipation is when a person:  Poops (has a bowel movement) fewer times in a week than normal.  Has a hard time pooping.  Has poop that is dry, hard, or bigger than normal. Follow these instructions at home: Eating and drinking   Eat  foods that have a lot of fiber, such as: ? Fresh fruits and vegetables. ? Whole grains. ? Beans.  Eat less of foods that are high in fat, low in fiber, or overly processed, such as: ? [place] fries. ? Hamburgers. ? Cookies. ? Candy. ? Soda.  Drink enough fluid to keep your pee (urine) clear or pale yellow. General instructions  Exercise regularly or as told by your doctor.  Go to the restroom when you feel like you need to poop. Do not  hold it in.  Take over-the-counter and prescription medicines only as told by your doctor. These include any fiber supplements.  Do pelvic floor retraining exercises, such as: ? Doing deep breathing while relaxing your lower belly (abdomen). ? Relaxing your pelvic floor while pooping.  Watch your condition for any changes.  Keep all follow-up visits as told by your doctor. This is important. Contact a doctor if:  You have pain that gets worse.  You have a fever.  You have not pooped for 4 days.  You throw up (vomit).  You are not hungry.  You lose weight.  You are bleeding from the anus.  You have thin, pencil-like poop (stool). Get help right away if:  You have a fever, and your symptoms suddenly get worse.  You leak poop or have blood in your poop.  Your belly feels hard or bigger than normal (is bloated).  You have very bad belly pain.  You feel dizzy or you faint. This information is not intended to replace advice given to you by your health care provider. Make sure you discuss any questions you have with your health care provider. Document Revised: 01/19/2017 Document Reviewed: 07/28/2015 Elsevier Patient Education  2020 [employer].

## 2019-09-03 LAB — CBC WITH DIFFERENTIAL/PLATELET
Basophils Absolute: 0 10*3/uL (ref 0.0–0.2)
Basos: 0 %
EOS (ABSOLUTE): 0.2 10*3/uL (ref 0.0–0.4)
Eos: 2 %
Hematocrit: 49.3 % (ref 37.5–51.0)
Hemoglobin: 16 g/dL (ref 13.0–17.7)
Immature Grans (Abs): 0.1 10*3/uL (ref 0.0–0.1)
Immature Granulocytes: 1 %
Lymphocytes Absolute: 2 10*3/uL (ref 0.7–3.1)
Lymphs: 22 %
MCH: 27.7 pg (ref 26.6–33.0)
MCHC: 32.5 g/dL (ref 31.5–35.7)
MCV: 85 fL (ref 79–97)
Monocytes Absolute: 0.4 10*3/uL (ref 0.1–0.9)
Monocytes: 5 %
Neutrophils Absolute: 6.4 10*3/uL (ref 1.4–7.0)
Neutrophils: 70 %
Platelets: 313 10*3/uL (ref 150–450)
RBC: 5.77 x10E6/uL (ref 4.14–5.80)
RDW: 13.1 % (ref 11.6–15.4)
WBC: 9.1 10*3/uL (ref 3.4–10.8)

## 2019-09-03 LAB — COMPREHENSIVE METABOLIC PANEL
ALT: 20 IU/L (ref 0–44)
AST: 24 IU/L (ref 0–40)
Albumin/Globulin Ratio: 1.7 (ref 1.2–2.2)
Albumin: 4.8 g/dL (ref 4.0–5.0)
Alkaline Phosphatase: 42 IU/L — ABNORMAL LOW (ref 48–121)
BUN/Creatinine Ratio: 8 — ABNORMAL LOW (ref 9–20)
BUN: 13 mg/dL (ref 6–20)
Bilirubin Total: 0.6 mg/dL (ref 0.0–1.2)
CO2: 25 mmol/L (ref 20–29)
Calcium: 10.1 mg/dL (ref 8.7–10.2)
Chloride: 100 mmol/L (ref 96–106)
Creatinine, Ser: 1.68 mg/dL — ABNORMAL HIGH (ref 0.76–1.27)
GFR calc Af Amer: 60 mL/min/{1.73_m2} (ref >59)
GFR calc non Af Amer: 52 mL/min/{1.73_m2} — ABNORMAL LOW (ref >59)
Globulin, Total: 2.9 g/dL (ref 1.5–4.5)
Glucose: 136 mg/dL — ABNORMAL HIGH (ref 65–99)
Potassium: 4.3 mmol/L (ref 3.5–5.2)
Sodium: 138 mmol/L (ref 134–144)
Total Protein: 7.7 g/dL (ref 6.0–8.5)

## 2019-09-03 LAB — LIPID PANEL
Chol/HDL Ratio: 3.4 ratio (ref 0.0–5.0)
Cholesterol, Total: 199 mg/dL (ref 100–199)
HDL: 58 mg/dL (ref >39)
LDL Chol Calc (NIH): 123 mg/dL — ABNORMAL HIGH (ref 0–99)
Triglycerides: 99 mg/dL (ref 0–149)
VLDL Cholesterol Cal: 18 mg/dL (ref 5–40)

## 2019-09-03 LAB — TSH: TSH: 1.8 u[IU]/mL (ref 0.450–4.500)

## 2019-09-04 NOTE — Progress Notes (Signed)
Patient with elevated creatinine on CMP 1.68 was the same over 1 year ago.  I recommend that he follow-up with nephrology and that we place a referral if he is in agreement.  Kidney function is still within normal limits at 52.  Verify if he is taking a lot of ibuprofen, NSAID such as Aleve or Motrin?.  CBC is within normal limits without any signs of anemia or infection.  TSH within normal limits for thyroid.  LDL elevated.  Discuss lifestyle modification with patient e.g. increase exercise, fiber, fruits, vegetables, lean meat, and omega 3/fish intake and decrease saturated fat.  If patient following strict diet and exercise program already please schedule follow up appointment with primary care physician  Advised hydration for kidneys with water, avoid alcohol, avoid any drug use, and follow-up with nephrology as referred.  Would like to recheck the CMP in 3 weeks.

## 2019-09-05 ENCOUNTER — Telehealth: Payer: Self-pay

## 2019-09-05 ENCOUNTER — Other Ambulatory Visit: Payer: Self-pay | Admitting: Adult Health

## 2019-09-05 DIAGNOSIS — K5909 Other constipation: Secondary | ICD-10-CM

## 2019-09-05 DIAGNOSIS — K219 Gastro-esophageal reflux disease without esophagitis: Secondary | ICD-10-CM

## 2019-09-05 NOTE — Telephone Encounter (Signed)
Pt advised. He agreed to a referral to GI.  He doesn't not have anyone in mind just someone local.   Thanks,   -Vernona Rieger

## 2019-09-05 NOTE — Telephone Encounter (Signed)
-----   Message from Berniece Pap, FNP sent at 09/02/2019  4:33 PM EDT ----- No significant stool burden seen, keep plan and recommend discontinue drug use. Will send to GI as well if he is in agreement. Restart Protonix sent to pharmacy.

## 2019-09-05 NOTE — Telephone Encounter (Signed)
Referral was placed 

## 2019-09-15 ENCOUNTER — Telehealth: Payer: Self-pay

## 2019-09-15 DIAGNOSIS — R7989 Other specified abnormal findings of blood chemistry: Secondary | ICD-10-CM

## 2019-09-15 NOTE — Telephone Encounter (Signed)
-----   Message from Berniece Pap, FNP sent at 09/04/2019  5:32 PM EDT ----- Patient with elevated creatinine on CMP 1.68 was the same over 1 year ago.  I recommend that he follow-up with nephrology and that we place a referral if he is in agreement.  Kidney function is still within normal limits at 52.  Verify if he is taking a lot of ibuprofen, NSAID such as Aleve or Motrin?.  CBC is within normal limits without any signs of anemia or infection.  TSH within normal limits for thyroid.  LDL elevated.  Discuss lifestyle modification with patient e.g. increase exercise, fiber, fruits, vegetables, lean meat, and omega 3/fish intake and decrease saturated fat.  If patient following strict diet and exercise program already please schedule follow up appointment with primary care physician  Advised hydration for kidneys with water, avoid alcohol, avoid any drug use, and follow-up with nephrology as referred.  Would like to recheck the CMP in 3 weeks.

## 2019-09-23 ENCOUNTER — Ambulatory Visit (INDEPENDENT_AMBULATORY_CARE_PROVIDER_SITE_OTHER): Payer: Medicaid Other | Admitting: Adult Health

## 2019-09-23 ENCOUNTER — Other Ambulatory Visit: Payer: Self-pay

## 2019-09-23 ENCOUNTER — Encounter: Payer: Self-pay | Admitting: Adult Health

## 2019-09-23 VITALS — BP 124/88 | HR 70 | Temp 97.7°F | Wt 231.0 lb

## 2019-09-23 DIAGNOSIS — R7989 Other specified abnormal findings of blood chemistry: Secondary | ICD-10-CM | POA: Diagnosis not present

## 2019-09-23 DIAGNOSIS — S46812A Strain of other muscles, fascia and tendons at shoulder and upper arm level, left arm, initial encounter: Secondary | ICD-10-CM | POA: Diagnosis not present

## 2019-09-23 DIAGNOSIS — K219 Gastro-esophageal reflux disease without esophagitis: Secondary | ICD-10-CM | POA: Diagnosis not present

## 2019-09-23 DIAGNOSIS — F1010 Alcohol abuse, uncomplicated: Secondary | ICD-10-CM

## 2019-09-23 DIAGNOSIS — K5909 Other constipation: Secondary | ICD-10-CM | POA: Diagnosis not present

## 2019-09-23 DIAGNOSIS — Z114 Encounter for screening for human immunodeficiency virus [HIV]: Secondary | ICD-10-CM

## 2019-09-23 DIAGNOSIS — B369 Superficial mycosis, unspecified: Secondary | ICD-10-CM

## 2019-09-23 DIAGNOSIS — Z720 Tobacco use: Secondary | ICD-10-CM

## 2019-09-23 DIAGNOSIS — F1910 Other psychoactive substance abuse, uncomplicated: Secondary | ICD-10-CM | POA: Insufficient documentation

## 2019-09-23 DIAGNOSIS — Z1159 Encounter for screening for other viral diseases: Secondary | ICD-10-CM | POA: Diagnosis not present

## 2019-09-23 MED ORDER — BACLOFEN 10 MG PO TABS: 10.0000 mg | ORAL_TABLET | Freq: Three times a day (TID) | ORAL | 0 refills | Status: DC

## 2019-09-23 MED ORDER — KETOCONAZOLE 2 % EX CREA: 1.0000 "application " | TOPICAL_CREAM | Freq: Two times a day (BID) | CUTANEOUS | 1 refills | Status: DC

## 2019-09-23 MED ORDER — PANTOPRAZOLE SODIUM 40 MG PO TBEC: 40.0000 mg | DELAYED_RELEASE_TABLET | Freq: Every day | ORAL | 0 refills | Status: DC

## 2019-09-23 NOTE — Patient Instructions (Addendum)
High-Fiber Diet Fiber, also called dietary fiber, is a type of carbohydrate that is found in fruits, vegetables, whole grains, and beans. A high-fiber diet can have many health benefits. Your health care provider may recommend a high-fiber diet to help:  Prevent constipation. Fiber can make your bowel movements more regular.  Lower your cholesterol.  Relieve the following conditions: ? Swelling of veins in the anus (hemorrhoids). ? Swelling and irritation (inflammation) of specific areas of the digestive tract (uncomplicated diverticulosis). ? A problem of the large intestine (colon) that sometimes causes pain and diarrhea (irritable bowel syndrome, IBS).  Prevent overeating as part of a weight-loss plan.  Prevent heart disease, type 2 diabetes, and certain cancers. What is my plan? The recommended daily fiber intake in grams (g) includes:  38 g for men age 60 or younger.  30 g for men over age 55.  45 g for women age 54 or younger.  21 g for women over age 81. You can get the recommended daily intake of dietary fiber by:  Eating a variety of fruits, vegetables, grains, and beans.  Taking a fiber supplement, if it is not possible to get enough fiber through your diet. What do I need to know about a high-fiber diet?  It is better to get fiber through food sources rather than from fiber supplements. There is not a lot of research about how effective supplements are.  Always check the fiber content on the nutrition facts label of any prepackaged food. Look for foods that contain 5 g of fiber or more per serving.  Talk with a diet and nutrition specialist (dietitian) if you have questions about specific foods that are recommended or not recommended for your medical condition, especially if those foods are not listed below.  Gradually increase how much fiber you consume. If you increase your intake of dietary fiber too quickly, you may have bloating, cramping, or gas.  Drink plenty  of water. Water helps you to digest fiber. What are tips for following this plan?  Eat a wide variety of high-fiber foods.  Make sure that half of the grains that you eat each day are whole grains.  Eat breads and cereals that are made with whole-grain flour instead of refined flour or white flour.  Eat brown rice, bulgur wheat, or millet instead of white rice.  Start the day with a breakfast that is high in fiber, such as a cereal that contains 5 g of fiber or more per serving.  Use beans in place of meat in soups, salads, and pasta dishes.  Eat high-fiber snacks, such as berries, raw vegetables, nuts, and popcorn.  Choose whole fruits and vegetables instead of processed forms like juice or sauce. What foods can I eat?  Fruits Berries. Pears. Apples. Oranges. Avocado. Prunes and raisins. Dried figs. Vegetables Sweet potatoes. Spinach. Kale. Artichokes. Cabbage. Broccoli. Cauliflower. Green peas. Carrots. Squash. Grains Whole-grain breads. Multigrain cereal. Oats and oatmeal. Brown rice. Barley. Bulgur wheat. Garden. Quinoa. Bran muffins. Popcorn. Rye wafer crackers. Meats and other proteins [place], kidney, and pinto beans. Soybeans. Split peas. Lentils. Nuts and seeds. Dairy Fiber-fortified yogurt. Beverages Fiber-fortified soy milk. Fiber-fortified orange juice. Other foods Fiber bars. The items listed above may not be a complete list of recommended foods and beverages. Contact a dietitian for more options. What foods are not recommended? Fruits Fruit juice. Cooked, strained fruit. Vegetables Fried potatoes. Canned vegetables. Well-cooked vegetables. Grains White bread. Pasta made with refined flour. White rice. Meats and other  proteins Fatty cuts of meat. Fried chicken or fried fish. Dairy Milk. Yogurt. Cream cheese. Sour cream. Fats and oils Butters. Beverages Soft drinks. Other foods Cakes and pastries. The items listed above may not be a complete list of foods  and beverages to avoid. Contact a dietitian for more information. Summary  Fiber is a type of carbohydrate. It is found in fruits, vegetables, whole grains, and beans.  There are many health benefits of eating a high-fiber diet, such as preventing constipation, lowering blood cholesterol, helping with weight loss, and reducing your risk of heart disease, diabetes, and certain cancers.  Gradually increase your intake of fiber. Increasing too fast can result in cramping, bloating, and gas. Drink plenty of water while you increase your fiber.  The best sources of fiber include whole fruits and vegetables, whole grains, nuts, seeds, and beans. This information is not intended to replace advice given to you by your health care provider. Make sure you discuss any questions you have with your health care provider. Document Revised: 12/11/2016 Document Reviewed: 12/11/2016 Elsevier Patient Education  [place] and Cholesterol Restricted Eating Plan Getting too much fat and cholesterol in your diet may cause health problems. Choosing the right foods helps keep your fat and cholesterol at normal levels. This can keep you from getting certain diseases. Your doctor may recommend an eating plan that includes:  Total fat: ______% or less of total calories a day.  Saturated fat: ______% or less of total calories a day.  Cholesterol: less than _________mg a day.  Fiber: ______g a day. What are tips for following this plan? Meal planning  At meals, divide your plate into four equal parts: ? Fill one-half of your plate with vegetables and green salads. ? Fill one-fourth of your plate with whole grains. ? Fill one-fourth of your plate with low-fat (lean) protein foods.  Eat fish that is high in omega-3 fats at least two times a week. This includes mackerel, tuna, sardines, and salmon.  Eat foods that are high in fiber, such as whole grains, beans, apples, broccoli, carrots, peas, and  barley. General tips   Work with your doctor to lose weight if you need to.  Avoid: ? Foods with added sugar. ? Fried foods. ? Foods with partially hydrogenated oils.  Limit alcohol intake to no more than 1 drink a day for nonpregnant women and 2 drinks a day for men. One drink equals 12 oz of beer, 5 oz of wine, or 1 oz of hard liquor. Reading food labels  Check food labels for: ? Trans fats. ? Partially hydrogenated oils. ? Saturated fat (g) in each serving. ? Cholesterol (mg) in each serving. ? Fiber (g) in each serving.  Choose foods with healthy fats, such as: ? Monounsaturated fats. ? Polyunsaturated fats. ? Omega-3 fats.  Choose grain products that have whole grains. Look for the word "whole" as the first word in the ingredient list. Cooking  Cook foods using low-fat methods. These include baking, boiling, grilling, and broiling.  Eat more home-cooked foods. Eat at restaurants and buffets less often.  Avoid cooking using saturated fats, such as butter, cream, palm oil, palm kernel oil, and coconut oil. Recommended foods  Fruits  All fresh, canned (in natural juice), or frozen fruits. Vegetables  Fresh or frozen vegetables (raw, steamed, roasted, or grilled). Green salads. Grains  Whole grains, such as whole wheat or whole grain breads, crackers, cereals, and pasta. Unsweetened oatmeal, bulgur, barley, quinoa, or brown rice.  Corn or whole wheat flour tortillas. Meats and other protein foods  Ground beef (85% or leaner), grass-fed beef, or beef trimmed of fat. Skinless chicken or [place]. Ground chicken or [place]. Pork trimmed of fat. All fish and seafood. Egg whites. Dried beans, peas, or lentils. Unsalted nuts or seeds. Unsalted canned beans. Nut butters without added sugar or oil. Dairy  Low-fat or nonfat dairy products, such as skim or 1% milk, 2% or reduced-fat cheeses, low-fat and fat-free ricotta or cottage cheese, or plain low-fat and nonfat  yogurt. Fats and oils  Tub margarine without trans fats. Light or reduced-fat mayonnaise and salad dressings. Avocado. Olive, canola, sesame, or safflower oils. The items listed above may not be a complete list of foods and beverages you can eat. Contact a dietitian for more information. Foods to avoid Fruits  Canned fruit in heavy syrup. Fruit in cream or butter sauce. Fried fruit. Vegetables  Vegetables cooked in cheese, cream, or butter sauce. Fried vegetables. Grains  White bread. White pasta. White rice. Cornbread. Bagels, pastries, and croissants. Crackers and snack foods that contain trans fat and hydrogenated oils. Meats and other protein foods  Fatty cuts of meat. Ribs, chicken wings, bacon, sausage, bologna, salami, chitterlings, fatback, hot dogs, bratwurst, and packaged lunch meats. Liver and organ meats. Whole eggs and egg yolks. Chicken and [place] with skin. Fried meat. Dairy  Whole or 2% milk, cream, half-and-half, and cream cheese. Whole milk cheeses. Whole-fat or sweetened yogurt. Full-fat cheeses. Nondairy creamers and whipped toppings. Processed cheese, cheese spreads, and cheese curds. Beverages  Alcohol. Sugar-sweetened drinks such as sodas, lemonade, and fruit drinks. Fats and oils  Butter, stick margarine, lard, shortening, ghee, or bacon fat. Coconut, palm kernel, and palm oils. Sweets and desserts  Corn syrup, sugars, honey, and molasses. Candy. Jam and jelly. Syrup. Sweetened cereals. Cookies, pies, cakes, donuts, muffins, and ice cream. The items listed above may not be a complete list of foods and beverages you should avoid. Contact a dietitian for more information. Summary  Choosing the right foods helps keep your fat and cholesterol at normal levels. This can keep you from getting certain diseases.  At meals, fill one-half of your plate with vegetables and green salads.  Eat high-fiber foods, like whole grains, beans, apples, carrots, peas, and  barley.  Limit added sugar, saturated fats, alcohol, and fried foods. This information is not intended to replace advice given to you by your health care provider. Make sure you discuss any questions you have with your health care provider. Document Revised: 10/10/2017 Document Reviewed: 10/24/2016 Elsevier Patient Education  2020 [place] Following a healthy eating pattern may help you to achieve and maintain a healthy body weight, reduce the risk of chronic disease, and live a long and productive life. It is important to follow a healthy eating pattern at an appropriate calorie level for your body. Your nutritional needs should be met primarily through food by choosing a variety of nutrient-rich foods. What are tips for following this plan? Reading food labels  Read labels and choose the following: ? Reduced or low sodium. ? Juices with 100% fruit juice. ? Foods with low saturated fats and high polyunsaturated and monounsaturated fats. ? Foods with whole grains, such as whole wheat, cracked wheat, brown rice, and wild rice. ? Whole grains that are fortified with folic acid. This is recommended for women who are pregnant or who want to become pregnant.  Read labels and avoid the following: ? Foods with  a lot of added sugars. These include foods that contain brown sugar, corn sweetener, corn syrup, dextrose, fructose, glucose, high-fructose corn syrup, honey, invert sugar, lactose, malt syrup, maltose, molasses, raw sugar, sucrose, trehalose, or turbinado sugar.  Do not eat more than the following amounts of added sugar per day:  6 teaspoons (25 g) for women.  9 teaspoons (38 g) for men. ? Foods that contain processed or refined starches and grains. ? Refined grain products, such as white flour, degermed cornmeal, white bread, and white rice. Shopping  Choose nutrient-rich snacks, such as vegetables, whole fruits, and nuts. Avoid high-calorie and high-sugar snacks,  such as potato chips, fruit snacks, and candy.  Use oil-based dressings and spreads on foods instead of solid fats such as butter, stick margarine, or cream cheese.  Limit pre-made sauces, mixes, and "instant" products such as flavored rice, instant noodles, and ready-made pasta.  Try more plant-protein sources, such as tofu, tempeh, black beans, edamame, lentils, nuts, and seeds.  Explore eating plans such as the Mediterranean diet or vegetarian diet. Cooking  Use oil to saut or stir-fry foods instead of solid fats such as butter, stick margarine, or lard.  Try baking, boiling, grilling, or broiling instead of frying.  Remove the fatty part of meats before cooking.  Steam vegetables in water or broth. Meal planning   At meals, imagine dividing your plate into fourths: ? One-half of your plate is fruits and vegetables. ? One-fourth of your plate is whole grains. ? One-fourth of your plate is protein, especially lean meats, poultry, eggs, tofu, beans, or nuts.  Include low-fat dairy as part of your daily diet. Lifestyle  Choose healthy options in all settings, including home, work, school, restaurants, or stores.  Prepare your food safely: ? Wash your hands after handling raw meats. ? Keep food preparation surfaces clean by regularly washing with hot, soapy water. ? Keep raw meats separate from ready-to-eat foods, such as fruits and vegetables. ? Cook seafood, meat, poultry, and eggs to the recommended internal temperature. ? Store foods at safe temperatures. In general:  Keep cold foods at 20F (4.4C) or below.  Keep hot foods at 120F (60C) or above.  Keep your freezer at [facility] (-17.8C) or below.  Foods are no longer safe to eat when they have been between the temperatures of 40-120F (4.4-60C) for more than 2 hours. What foods should I eat? Fruits Aim to eat 2 cup-equivalents of fresh, canned (in natural juice), or frozen fruits each day. Examples of 1  cup-equivalent of fruit include 1 small apple, 8 large strawberries, 1 cup canned fruit,  cup dried fruit, or 1 cup 100% juice. Vegetables Aim to eat 2-3 cup-equivalents of fresh and frozen vegetables each day, including different varieties and colors. Examples of 1 cup-equivalent of vegetables include 2 medium carrots, 2 cups raw, leafy greens, 1 cup chopped vegetable (raw or cooked), or 1 medium baked potato. Grains Aim to eat 6 ounce-equivalents of whole grains each day. Examples of 1 ounce-equivalent of grains include 1 slice of bread, 1 cup ready-to-eat cereal, 3 cups popcorn, or  cup cooked rice, pasta, or cereal. Meats and other proteins Aim to eat 5-6 ounce-equivalents of protein each day. Examples of 1 ounce-equivalent of protein include 1 egg, 1/2 cup nuts or seeds, or 1 tablespoon (16 g) peanut butter. A cut of meat or fish that is the size of a deck of cards is about 3-4 ounce-equivalents.  Of the protein you eat each week, try to  have at least 8 ounces come from seafood. This includes salmon, trout, herring, and anchovies. Dairy Aim to eat 3 cup-equivalents of fat-free or low-fat dairy each day. Examples of 1 cup-equivalent of dairy include 1 cup (240 mL) milk, 8 ounces (250 g) yogurt, 1 ounces (44 g) natural cheese, or 1 cup (240 mL) fortified soy milk. Fats and oils  Aim for about 5 teaspoons (21 g) per day. Choose monounsaturated fats, such as canola and olive oils, avocados, peanut butter, and most nuts, or polyunsaturated fats, such as sunflower, corn, and soybean oils, walnuts, pine nuts, sesame seeds, sunflower seeds, and flaxseed. Beverages  Aim for six 8-oz glasses of water per day. Limit coffee to three to five 8-oz cups per day.  Limit caffeinated beverages that have added calories, such as soda and energy drinks.  Limit alcohol intake to no more than 1 drink a day for nonpregnant women and 2 drinks a day for men. One drink equals 12 oz of beer (355 mL), 5 oz of wine  (148 mL), or 1 oz of hard liquor (44 mL). Seasoning and other foods  Avoid adding excess amounts of salt to your foods. Try flavoring foods with herbs and spices instead of salt.  Avoid adding sugar to foods.  Try using oil-based dressings, sauces, and spreads instead of solid fats. This information is based on general [place] nutrition guidelines. For more information, visit [employer]. Exact amounts may vary based on your nutrition needs. Summary  A healthy eating plan may help you to maintain a healthy weight, reduce the risk of chronic diseases, and stay active throughout your life.  Plan your meals. Make sure you eat the right portions of a variety of nutrient-rich foods.  Try baking, boiling, grilling, or broiling instead of frying.  Choose healthy options in all settings, including home, work, school, restaurants, or stores. This information is not intended to replace advice given to you by your health care provider. Make sure you discuss any questions you have with your health care provider. Document Revised: 05/21/2017 Document Reviewed: 05/21/2017 Elsevier Patient Education  2020 [employer]. https://www.cdc.gov/vaccines/hcp/vis/vis-statements/tdap.pdf">  Tdap (Tetanus, Diphtheria, Pertussis) Vaccine: What You Need to Know 1. Why get vaccinated? Tdap vaccine can prevent tetanus, diphtheria, and pertussis. Diphtheria and pertussis spread from person to person. Tetanus enters the body through cuts or wounds.  TETANUS (T) causes painful stiffening of the muscles. Tetanus can lead to serious health problems, including being unable to open the mouth, having trouble swallowing and breathing, or death.  DIPHTHERIA (D) can lead to difficulty breathing, heart failure, paralysis, or death.  PERTUSSIS (aP), also known as "whooping cough," can cause uncontrollable, violent coughing which makes it hard to breathe, eat, or drink. Pertussis can be extremely serious in babies and  young children, causing pneumonia, convulsions, brain damage, or death. In teens and adults, it can cause weight loss, loss of bladder control, passing out, and rib fractures from severe coughing. 2. Tdap vaccine Tdap is only for children 7 years and older, adolescents, and adults.  Adolescents should receive a single dose of Tdap, preferably at age 46 or 12 years. Pregnant women should get a dose of Tdap during every pregnancy, to protect the newborn from pertussis. Infants are most at risk for severe, life-threatening complications from pertussis. Adults who have never received Tdap should get a dose of Tdap. Also, adults should receive a booster dose every 10 years, or earlier in the case of a severe and dirty wound or burn. Booster  doses can be either Tdap or Td (a different vaccine that protects against tetanus and diphtheria but not pertussis). Come to lab around 10/24/19 to check labs- walk in  Monday to Friday 8 am - 4pm closed for lunch 12 pm to 1pm.   Baclofen tablets What is this medicine? BACLOFEN (BAK loe fen) helps relieve spasms and cramping of muscles. It may be used to treat symptoms of multiple sclerosis or spinal cord injury. This medicine may be used for other purposes; ask your health care provider or pharmacist if you have questions. COMMON BRAND NAME(S): ED Baclofen, Lioresal What should I tell my health care provider before I take this medicine? They need to know if you have any of these conditions:  kidney disease  seizures  stroke  an unusual or allergic reaction to baclofen, other medicines, foods, dyes, or preservatives  pregnant or trying to get pregnant  breast-feeding How should I use this medicine? Take this medicine by mouth. Swallow it with a drink of water. Follow the directions on the prescription label. Do not take more medicine than you are told to take. Talk to your pediatrician regarding the use of this medicine in children. Special care may be  needed. Overdosage: If you think you have taken too much of this medicine contact a poison control center or emergency room at once. NOTE: This medicine is only for you. Do not share this medicine with others. What if I miss a dose? If you miss a dose, take it as soon as you can. If it is almost time for your next dose, take only that dose. Do not take double or extra doses. What may interact with this medicine? Do not take this medication with any of the following medicines:  narcotic medicines for cough This medicine may also interact with the following medications:  alcohol  antihistamines for allergy, cough and cold  certain medicines for anxiety or sleep  certain medicines for depression like amitriptyline, fluoxetine, sertraline  certain medicines for seizures like phenobarbital, primidone  general anesthetics like halothane, isoflurane, methoxyflurane, propofol  local anesthetics like lidocaine, pramoxine, tetracaine  medicines that relax muscles for surgery  narcotic medicines for pain  phenothiazines like chlorpromazine, mesoridazine, prochlorperazine, thioridazine This list may not describe all possible interactions. Give your health care provider a list of all the medicines, herbs, non-prescription drugs, or dietary supplements you use. Also tell them if you smoke, drink alcohol, or use illegal drugs. Some items may interact with your medicine. What should I watch for while using this medicine? Tell your doctor or health care professional if your symptoms do not start to get better or if they get worse. Do not suddenly stop taking your medicine. If you do, you may develop a severe reaction. If your doctor wants you to stop the medicine, the dose will be slowly lowered over time to avoid any side effects. Follow the advice of your doctor. You may get drowsy or dizzy. Do not drive, use machinery, or do anything that needs mental alertness until you know how this medicine  affects you. Do not stand or sit up quickly, especially if you are an older patient. This reduces the risk of dizzy or fainting spells. Alcohol may interfere with the effect of this medicine. Avoid alcoholic drinks. If you are taking another medicine that also causes drowsiness, you may have more side effects. Give your health care provider a list of all medicines you use. Your doctor will tell you how much medicine  to take. Do not take more medicine than directed. Call emergency for help if you have problems breathing or unusual sleepiness. What side effects may I notice from receiving this medicine? Side effects that you should report to your doctor or health care professional as soon as possible:  allergic reactions like skin rash, itching or hives, swelling of the face, lips, or tongue  breathing problems  changes in emotions or moods  changes in vision  chest pain  fast, irregular heartbeat  feeling faint or lightheaded, falls  hallucinations  loss of balance or coordination  ringing of the ears  seizures  trouble passing urine or change in the amount of urine  trouble walking  unusually weak or tired Side effects that usually do not require medical attention (report to your doctor or health care professional if they continue or are bothersome):  changes in taste  confusion  constipation  diarrhea  dry mouth  headache  muscle weakness  nausea, vomiting  trouble sleeping This list may not describe all possible side effects. Call your doctor for medical advice about side effects. You may report side effects to FDA at 1-800-FDA-1088. Where should I keep my medicine? Keep out of the reach of children. Store at room temperature between 15 and 30 degrees C (59 and 86 degrees F). Keep container tightly closed. Throw away any unused medicine after the expiration date. NOTE: This sheet is a summary. It may not cover all possible information. If you have questions  about this medicine, talk to your doctor, pharmacist, or health care provider.  2020 Elsevier/Gold Standard (2016-11-18 09:56:42) Tdap may be given at the same time as other vaccines. 3. Talk with your health care provider Tell your vaccine provider if the person getting the vaccine:  Has had an allergic reaction after a previous dose of any vaccine that protects against tetanus, diphtheria, or pertussis, or has any severe, life-threatening allergies.  Has had a coma, decreased level of consciousness, or prolonged seizures within 7 days after a previous dose of any pertussis vaccine (DTP, DTaP, or Tdap).  Has seizures or another nervous system problem.  Has ever had Guillain-Barr Syndrome (also called GBS).  Has had severe pain or swelling after a previous dose of any vaccine that protects against tetanus or diphtheria. In some cases, your health care provider may decide to postpone Tdap vaccination to a future visit.  People with minor illnesses, such as a cold, may be vaccinated. People who are moderately or severely ill should usually wait until they recover before getting Tdap vaccine.  Your health care provider can give you more information. 4. Risks of a vaccine reaction  Pain, redness, or swelling where the shot was given, mild fever, headache, feeling tired, and nausea, vomiting, diarrhea, or stomachache sometimes happen after Tdap vaccine. People sometimes faint after medical procedures, including vaccination. Tell your provider if you feel dizzy or have vision changes or ringing in the ears.  As with any medicine, there is a very remote chance of a vaccine causing a severe allergic reaction, other serious injury, or death. 5. What if there is a serious problem? An allergic reaction could occur after the vaccinated person leaves the clinic. If you see signs of a severe allergic reaction (hives, swelling of the face and throat, difficulty breathing, a fast heartbeat, dizziness, or  weakness), call 9-1-1 and get the person to the nearest hospital. For other signs that concern you, call your health care provider.  Adverse reactions should be  reported to the Vaccine Adverse Event Reporting System (VAERS). Your health care provider will usually file this report, or you can do it yourself. Visit the VAERS website at www.vaers.SamedayNews.es or call 417 483 2743. VAERS is only for reporting reactions, and VAERS staff do not give medical advice. 6. The National Vaccine Injury Compensation Program The [employer] Vaccine Injury Compensation Program (VICP) is a federal program that was created to compensate people who may have been injured by certain vaccines. Visit the VICP website at GoldCloset.com.ee or call 718-218-5871 to learn about the program and about filing a claim. There is a time limit to file a claim for compensation. 7. How can I learn more?  Ask your health care provider.  Call your local or state health department.  Contact the Centers for Disease Control and Prevention (CDC): ? Call (646)125-2663 (1-800-CDC-INFO) or ? Visit CDC's website at http://hunter.com/ Vaccine Information Statement Tdap (Tetanus, Diphtheria, Pertussis) Vaccine (05/22/2018) This information is not intended to replace advice given to you by your health care provider. Make sure you discuss any questions you have with your health care provider. Document Revised: 05/31/2018 Document Reviewed: 06/03/2018 Elsevier Patient Education  [place] Calories are units of energy. Your body needs a certain amount of calories from food to keep you going throughout the day. When you eat more calories than your body needs, your body stores the extra calories as fat. When you eat fewer calories than your body needs, your body burns fat to get the energy it needs. Calorie counting means keeping track of how many calories you eat and drink each day.  Calorie counting can be helpful if you need to lose weight. If you make sure to eat fewer calories than your body needs, you should lose weight. Ask your health care provider what a healthy weight is for you. For calorie counting to work, you will need to eat the right number of calories in a day in order to lose a healthy amount of weight per week. A dietitian can help you determine how many calories you need in a day and will give you suggestions on how to reach your calorie goal.  A healthy amount of weight to lose per week is usually 1-2 lb (0.5-0.9 kg). This usually means that your daily calorie intake should be reduced by 500-750 calories.  Eating 1,200 - 1,500 calories per day can help most women lose weight.  Eating 1,500 - 1,800 calories per day can help most men lose weight. What is my plan? My goal is to have __________ calories per day. If I have this many calories per day, I should lose around __________ pounds per week. What do I need to know about calorie counting? In order to meet your daily calorie goal, you will need to:  Find out how many calories are in each food you would like to eat. Try to do this before you eat.  Decide how much of the food you plan to eat.  Write down what you ate and how many calories it had. Doing this is called keeping a food log. To successfully lose weight, it is important to balance calorie counting with a healthy lifestyle that includes regular activity. Aim for 150 minutes of moderate exercise (such as walking) or 75 minutes of vigorous exercise (such as running) each week. Where do I find calorie information?  The number of calories in a food can be found on a Nutrition Facts label. If  a food does not have a Nutrition Facts label, try to look up the calories online or ask your dietitian for help. Remember that calories are listed per serving. If you choose to have more than one serving of a food, you will have to multiply the calories per  serving by the amount of servings you plan to eat. For example, the label on a package of bread might say that a serving size is 1 slice and that there are 90 calories in a serving. If you eat 1 slice, you will have eaten 90 calories. If you eat 2 slices, you will have eaten 180 calories. How do I keep a food log? Immediately after each meal, record the following information in your food log:  What you ate. Don't forget to include toppings, sauces, and other extras on the food.  How much you ate. This can be measured in cups, ounces, or number of items.  How many calories each food and drink had.  The total number of calories in the meal. Keep your food log near you, such as in a small notebook in your pocket, or use a mobile app or website. Some programs will calculate calories for you and show you how many calories you have left for the day to meet your goal. What are some calorie counting tips?   Use your calories on foods and drinks that will fill you up and not leave you hungry: ? Some examples of foods that fill you up are nuts and nut butters, vegetables, lean proteins, and high-fiber foods like whole grains. High-fiber foods are foods with more than 5 g fiber per serving. ? Drinks such as sodas, specialty coffee drinks, alcohol, and juices have a lot of calories, yet do not fill you up.  Eat nutritious foods and avoid empty calories. Empty calories are calories you get from foods or beverages that do not have many vitamins or protein, such as candy, sweets, and soda. It is better to have a nutritious high-calorie food (such as an avocado) than a food with few nutrients (such as a bag of chips).  Know how many calories are in the foods you eat most often. This will help you calculate calorie counts faster.  Pay attention to calories in drinks. Low-calorie drinks include water and unsweetened drinks.  Pay attention to nutrition labels for "low fat" or "fat free" foods. These foods  sometimes have the same amount of calories or more calories than the full fat versions. They also often have added sugar, starch, or salt, to make up for flavor that was removed with the fat.  Find a way of tracking calories that works for you. Get creative. Try different apps or programs if writing down calories does not work for you. What are some portion control tips?  Know how many calories are in a serving. This will help you know how many servings of a certain food you can have.  Use a measuring cup to measure serving sizes. You could also try weighing out portions on a kitchen scale. With time, you will be able to estimate serving sizes for some foods.  Take some time to put servings of different foods on your favorite plates, bowls, and cups so you know what a serving looks like.  Try not to eat straight from a bag or box. Doing this can lead to overeating. Put the amount you would like to eat in a cup or on a plate to make sure you are  eating the right portion.  Use smaller plates, glasses, and bowls to prevent overeating.  Try not to multitask (for example, watch TV or use your computer) while eating. If it is time to eat, sit down at a table and enjoy your food. This will help you to know when you are full. It will also help you to be aware of what you are eating and how much you are eating. What are tips for following this plan? Reading food labels  Check the calorie count compared to the serving size. The serving size may be smaller than what you are used to eating.  Check the source of the calories. Make sure the food you are eating is high in vitamins and protein and low in saturated and trans fats. Shopping  Read nutrition labels while you shop. This will help you make healthy decisions before you decide to purchase your food.  Make a grocery list and stick to it. Cooking  Try to cook your favorite foods in a healthier way. For example, try baking instead of  frying.  Use low-fat dairy products. Meal planning  Use more fruits and vegetables. Half of your plate should be fruits and vegetables.  Include lean proteins like poultry and fish. How do I count calories when eating out?  Ask for smaller portion sizes.  Consider sharing an entree and sides instead of getting your own entree.  If you get your own entree, eat only half. Ask for a box at the beginning of your meal and put the rest of your entree in it so you are not tempted to eat it.  If calories are listed on the menu, choose the lower calorie options.  Choose dishes that include vegetables, fruits, whole grains, low-fat dairy products, and lean protein.  Choose items that are boiled, broiled, grilled, or steamed. Stay away from items that are buttered, battered, fried, or served with cream sauce. Items labeled "crispy" are usually fried, unless stated otherwise.  Choose water, low-fat milk, unsweetened iced tea, or other drinks without added sugar. If you want an alcoholic beverage, choose a lower calorie option such as a glass of wine or light beer.  Ask for dressings, sauces, and syrups on the side. These are usually high in calories, so you should limit the amount you eat.  If you want a salad, choose a garden salad and ask for grilled meats. Avoid extra toppings like bacon, cheese, or fried items. Ask for the dressing on the side, or ask for olive oil and vinegar or lemon to use as dressing.  Estimate how many servings of a food you are given. For example, a serving of cooked rice is  cup or about the size of half a baseball. Knowing serving sizes will help you be aware of how much food you are eating at restaurants. The list below tells you how big or small some common portion sizes are based on everyday objects: ? 1 oz--4 stacked dice. ? 3 oz--1 deck of cards. ? 1 tsp--1 die. ? 1 Tbsp-- a ping-pong ball. ? 2 Tbsp--1 ping-pong ball. ?  cup-- baseball. ? 1 cup--1  baseball. Summary  Calorie counting means keeping track of how many calories you eat and drink each day. If you eat fewer calories than your body needs, you should lose weight.  A healthy amount of weight to lose per week is usually 1-2 lb (0.5-0.9 kg). This usually means reducing your daily calorie intake by 500-750 calories.  The number  of calories in a food can be found on a Nutrition Facts label. If a food does not have a Nutrition Facts label, try to look up the calories online or ask your dietitian for help.  Use your calories on foods and drinks that will fill you up, and not on foods and drinks that will leave you hungry.  Use smaller plates, glasses, and bowls to prevent overeating. This information is not intended to replace advice given to you by your health care provider. Make sure you discuss any questions you have with your health care provider. Document Revised: 10/26/2017 Document Reviewed: 01/07/2016 Elsevier Patient Education  [place] and Cholesterol Restricted Eating Plan Getting too much fat and cholesterol in your diet may cause health problems. Choosing the right foods helps keep your fat and cholesterol at normal levels. This can keep you from getting certain diseases. Your doctor may recommend an eating plan that includes:  Total fat: ______% or less of total calories a day.  Saturated fat: ______% or less of total calories a day.  Cholesterol: less than _________mg a day.  Fiber: ______g a day. What are tips for following this plan? Meal planning  At meals, divide your plate into four equal parts: ? Fill one-half of your plate with vegetables and green salads. ? Fill one-fourth of your plate with whole grains. ? Fill one-fourth of your plate with low-fat (lean) protein foods.  Eat fish that is high in omega-3 fats at least two times a week. This includes mackerel, tuna, sardines, and salmon.  Eat foods that are high in fiber, such as  whole grains, beans, apples, broccoli, carrots, peas, and barley. General tips   Work with your doctor to lose weight if you need to.  Avoid: ? Foods with added sugar. ? Fried foods. ? Foods with partially hydrogenated oils.  Limit alcohol intake to no more than 1 drink a day for nonpregnant women and 2 drinks a day for men. One drink equals 12 oz of beer, 5 oz of wine, or 1 oz of hard liquor. Reading food labels  Check food labels for: ? Trans fats. ? Partially hydrogenated oils. ? Saturated fat (g) in each serving. ? Cholesterol (mg) in each serving. ? Fiber (g) in each serving.  Choose foods with healthy fats, such as: ? Monounsaturated fats. ? Polyunsaturated fats. ? Omega-3 fats.  Choose grain products that have whole grains. Look for the word "whole" as the first word in the ingredient list. Cooking  Cook foods using low-fat methods. These include baking, boiling, grilling, and broiling.  Eat more home-cooked foods. Eat at restaurants and buffets less often.  Avoid cooking using saturated fats, such as butter, cream, palm oil, palm kernel oil, and coconut oil. Recommended foods  Fruits  All fresh, canned (in natural juice), or frozen fruits. Vegetables  Fresh or frozen vegetables (raw, steamed, roasted, or grilled). Green salads. Grains  Whole grains, such as whole wheat or whole grain breads, crackers, cereals, and pasta. Unsweetened oatmeal, bulgur, barley, quinoa, or brown rice. Corn or whole wheat flour tortillas. Meats and other protein foods  Ground beef (85% or leaner), grass-fed beef, or beef trimmed of fat. Skinless chicken or [place]. Ground chicken or [place]. Pork trimmed of fat. All fish and seafood. Egg whites. Dried beans, peas, or lentils. Unsalted nuts or seeds. Unsalted canned beans. Nut butters without added sugar or oil. Dairy  Low-fat or nonfat dairy products, such as skim or 1% milk,  2% or reduced-fat cheeses, low-fat and fat-free ricotta  or cottage cheese, or plain low-fat and nonfat yogurt. Fats and oils  Tub margarine without trans fats. Light or reduced-fat mayonnaise and salad dressings. Avocado. Olive, canola, sesame, or safflower oils. The items listed above may not be a complete list of foods and beverages you can eat. Contact a dietitian for more information. Foods to avoid Fruits  Canned fruit in heavy syrup. Fruit in cream or butter sauce. Fried fruit. Vegetables  Vegetables cooked in cheese, cream, or butter sauce. Fried vegetables. Grains  White bread. White pasta. White rice. Cornbread. Bagels, pastries, and croissants. Crackers and snack foods that contain trans fat and hydrogenated oils. Meats and other protein foods  Fatty cuts of meat. Ribs, chicken wings, bacon, sausage, bologna, salami, chitterlings, fatback, hot dogs, bratwurst, and packaged lunch meats. Liver and organ meats. Whole eggs and egg yolks. Chicken and [place] with skin. Fried meat. Dairy  Whole or 2% milk, cream, half-and-half, and cream cheese. Whole milk cheeses. Whole-fat or sweetened yogurt. Full-fat cheeses. Nondairy creamers and whipped toppings. Processed cheese, cheese spreads, and cheese curds. Beverages  Alcohol. Sugar-sweetened drinks such as sodas, lemonade, and fruit drinks. Fats and oils  Butter, stick margarine, lard, shortening, ghee, or bacon fat. Coconut, palm kernel, and palm oils. Sweets and desserts  Corn syrup, sugars, honey, and molasses. Candy. Jam and jelly. Syrup. Sweetened cereals. Cookies, pies, cakes, donuts, muffins, and ice cream. The items listed above may not be a complete list of foods and beverages you should avoid. Contact a dietitian for more information. Summary  Choosing the right foods helps keep your fat and cholesterol at normal levels. This can keep you from getting certain diseases.  At meals, fill one-half of your plate with vegetables and green salads.  Eat high-fiber foods, like whole  grains, beans, apples, carrots, peas, and barley.  Limit added sugar, saturated fats, alcohol, and fried foods. This information is not intended to replace advice given to you by your health care provider. Make sure you discuss any questions you have with your health care provider. Document Revised: 10/10/2017 Document Reviewed: 10/24/2016 Elsevier Patient Education  [place].

## 2019-09-23 NOTE — Progress Notes (Signed)
Established patient visit   Patient: Cody Delgado   DOB: [birth year]   34 y.o. Male  MRN: [id number] Visit Date: 09/23/2019  Today's healthcare provider: Jairo BenMichelle Smith Jabarie Pop, FNP   Chief Complaint  Patient presents with  . Constipation   Subjective    Constipation This is a chronic problem. The problem has been gradually improving since onset. The patient is on a high fiber diet. He does not exercise regularly. There has not been adequate water intake. Associated symptoms include bloating and diarrhea (has decreased Miralax and resolved. ). Pertinent negatives include no abdominal pain, anorexia, back pain, difficulty urinating, fecal incontinence, fever, flatus, hematochezia, hemorrhoids, melena, nausea, rectal pain, vomiting or weight loss. Risk factors include immobility, change in medication usage/dosage, obesity and stress (substance abuse ). He has tried laxatives and fiber for the symptoms. The treatment provided moderate relief. There is no history of abdominal surgery, endocrine disease, inflammatory bowel disease, irritable bowel syndrome, metabolic disease, neurologic disease, neuromuscular disease, psychiatric history or radiation treatment.     He says he is doing Metamucil, reports that constipation is 40 % improved. He has noticed larger softer stools.  He is using Miralax less now. Denies any bleeding , odor, dark tarry stools or black stools.  Has referral to nephrology 11/11/19 elevated creatinine. GI appointment is 11/12/19 advised to avoid NSAID's. He is drinking to much alcohol, is occasionally doing cocaine.    he reports he is having bowel movement once a day now or at least every other day.   He also pulled a muscle in his back left upper side while lifting some heavy boxes around a week ago. Gradually improving with tylenol.  Denies any loss of bowel or bladder control.  Denies saddle paresthesias.  Denies radiculopathy/ paresthesias.   Rash on  left hand dry and skin  flakes and itching  Present x 1 month. No known exposure. Does have tattoo on this hand.  Patient  denies any fever, body aches,chills,chest pain, shortness of breath, nausea, vomiting, or diarrhea.  Denies dizziness, lightheadedness, pre syncopal or syncopal episodes.    Medications: Outpatient Medications Prior to Visit  Medication Sig  . psyllium (METAMUCIL SMOOTH TEXTURE) 58.6 % powder Take 1 packet by mouth 3 (three) times daily.  . [DISCONTINUED] pantoprazole (PROTONIX) 40 MG tablet Take 1 tablet (40 mg total) by mouth daily.  . polyethylene glycol powder (GLYCOLAX/MIRALAX) 17 GM/SCOOP powder Take 17 g by mouth daily. (Patient not taking: Reported on 09/23/2019)   No facility-administered medications prior to visit.    Review of Systems  Constitutional: Negative for fever and weight loss.  Gastrointestinal: Positive for bloating, constipation and diarrhea (has decreased Miralax and resolved. ). Negative for abdominal pain, anorexia, flatus, hematochezia, hemorrhoids, melena, nausea, rectal pain and vomiting.  Genitourinary: Negative for difficulty urinating.  Musculoskeletal: Negative for back pain.      Objective    BP 124/88 (BP Location: Right Arm, Patient Position: Sitting, Cuff Size: Large)   Pulse 70   Temp 97.7 F (36.5 C) (Oral)   Wt 231 lb (104.8 kg)   SpO2 99%   BMI 31.33 kg/m    Physical Exam Vitals and nursing note reviewed.  Constitutional:      General: He is not in acute distress.    Appearance: Normal appearance. He is well-developed. He is not ill-appearing, toxic-appearing or diaphoretic.     Comments: Patient is alert and oriented and responsive to questions Engages in eye contact with provider. Speaks  in full sentences without any pauses without any shortness of breath or distress.  Marland Kitchen  HENT:     Head: Normocephalic and atraumatic.     Right Ear: Hearing, tympanic membrane, ear canal and external ear normal.     Left Ear:  Hearing, tympanic membrane, ear canal and external ear normal.     Nose: Nose normal.     Mouth/Throat:     Pharynx: Uvula midline. No oropharyngeal exudate.  Eyes:     General: Lids are normal. No scleral icterus.       Right eye: No discharge.        Left eye: No discharge.     Conjunctiva/sclera: Conjunctivae normal.     Pupils: Pupils are equal, round, and reactive to light.  Neck:     Thyroid: No thyromegaly.     Vascular: Normal carotid pulses. No carotid bruit, hepatojugular reflux or JVD.     Trachea: Trachea and phonation normal. No tracheal tenderness or tracheal deviation.     Meningeal: Brudzinski's sign absent.  Cardiovascular:     Rate and Rhythm: Normal rate and regular rhythm.     Pulses: Normal pulses.     Heart sounds: Normal heart sounds, S1 normal and S2 normal. Heart sounds not distant. No murmur heard.  No friction rub. No gallop.   Pulmonary:     Effort: Pulmonary effort is normal. No accessory muscle usage or respiratory distress.     Breath sounds: Normal breath sounds. No stridor. No wheezing, rhonchi or rales.  Chest:     Chest wall: No tenderness.  Abdominal:     General: Bowel sounds are normal. There is no distension.     Palpations: Abdomen is soft. There is no mass.     Tenderness: There is no abdominal tenderness. There is no right CVA tenderness, left CVA tenderness, guarding or rebound.     Hernia: No hernia is present.  Musculoskeletal:        General: No tenderness or deformity. Normal range of motion.     Cervical back: Full passive range of motion without pain, normal range of motion and neck supple.     Comments: Patient moves on and off of exam table and in room without difficulty. Gait is normal in hall and in room. Patient is oriented to person place time and situation. Patient answers questions appropriately and engages in conversation.   Lymphadenopathy:     Head:     Right side of head: No submental, submandibular, tonsillar,  preauricular, posterior auricular or occipital adenopathy.     Left side of head: No submental, submandibular, tonsillar, preauricular, posterior auricular or occipital adenopathy.     Cervical: No cervical adenopathy.  Skin:    General: Skin is warm and dry.     Capillary Refill: Capillary refill takes less than 2 seconds.     Coloration: Skin is not pale.     Findings: No erythema or rash.     Nails: There is no clubbing.       Neurological:     General: No focal deficit present.     Mental Status: He is alert and oriented to person, place, and time.     GCS: GCS eye subscore is 4. GCS verbal subscore is 5. GCS motor subscore is 6.     Cranial Nerves: No cranial nerve deficit.     Sensory: No sensory deficit.     Motor: No weakness or abnormal muscle tone.  Coordination: Coordination normal.     Gait: Gait normal.     Deep Tendon Reflexes: Reflexes are normal and symmetric. Reflexes normal.  Psychiatric:        Mood and Affect: Mood normal.        Speech: Speech normal.        Behavior: Behavior normal.        Thought Content: Thought content normal.        Judgment: Judgment normal.      No results found for any visits on 09/23/19.  Assessment & Plan      1. Elevated serum creatinine Avoid NSAID's, cocaine and alcohol. Keep neurology appointment.  - CBC with Differential/Platelet; Future - Comprehensive Metabolic Panel (CMET); Future - Magnesium - US Renal Artery Stenosis  2. Other constipation Continue Metamucil as directed, can discontinue Miralax use to as needed basis.  Discontinue cocaine, alcohol and smoking. High fiber diet. Has GI follow up new patient appointment.  Red Flags discussed. The patient was given clear instructions to go to ER or return to medical center if any red flags develop, symptoms do not improve, worsen or new problems develop. They verbalized understanding.   3. Gastroesophageal reflux disease, unspecified whether esophagitis  present Continue Protonix has resolved GERD since starting.  4. Fungal rash of torso- left hand   - ketoconazole (NIZORAL) 2 % cream; Apply 1 application topically 2 (two) times daily.  Dispense: 15 g; Refill: 1  Return if worsening at any time.   5. Screening for HIV (human immunodeficiency virus) Screening only , does have tattoo history.  - HIV antibody (with reflex)  6. Need for hepatitis C screening test - Hepatitis c antibody (reflex)  7. Strain of left subscapularis muscle, initial encounter Baclofen as prescribed PRN and not with any other sedatives.  Rest the affected painful area.  Apply cold compresses intermittently as needed.  As pain recedes, begin normal activities slowly as tolerated.  Call if symptoms persist.   8. Substance abuse (HCC) Recommend discontinuation of cocaine and marijuana. Resources available discussed.    9. Tobacco abuse Smoking cessation instruction/counseling given:  counseled patient on the dangers of tobacco use, advised patient to stop smoking, and reviewed strategies to maximize success  9. Alcohol Abuse recommend discontinuation. CAGE at next visit.   Meds ordered this encounter  Medications  . ketoconazole (NIZORAL) 2 % cream    Sig: Apply 1 application topically 2 (two) times daily.    Dispense:  15 g    Refill:  1  . baclofen (LIORESAL) 10 MG tablet    Sig: Take 1 tablet (10 mg total) by mouth 3 (three) times daily. Will cause drowsiness.    Dispense:  30 each    Refill:  0  . pantoprazole (PROTONIX) 40 MG tablet    Sig: Take 1 tablet (40 mg total) by mouth daily.    Dispense:  90 tablet    Refill:  0    Red Flags discussed. The patient was given clear instructions to go to ER or return to medical center if any red flags develop, symptoms do not improve, worsen or new problems develop. They verbalized understanding.   Return if symptoms worsen or fail to improve, for at any time for any worsening symptoms, Go to Emergency  room/ urgent care if worse.      IBeverely Pace Toniann Dickerson, FNP, have reviewed all documentation for this visit. The documentation on 09/23/19 for the exam, diagnosis, procedures, and orders are all  accurate and complete.    Jairo Ben, FNP  [facility] [phone] (phone) [phone] (fax)  [facility]

## 2019-10-02 ENCOUNTER — Encounter: Payer: Self-pay | Admitting: Adult Health

## 2019-10-02 ENCOUNTER — Other Ambulatory Visit: Payer: Self-pay

## 2019-10-02 ENCOUNTER — Ambulatory Visit: Admission: RE | Admit: 2019-10-02 | Payer: Medicaid Other | Source: Ambulatory Visit

## 2019-10-12 ENCOUNTER — Encounter: Payer: Self-pay | Admitting: Adult Health

## 2019-10-12 DIAGNOSIS — K5904 Chronic idiopathic constipation: Secondary | ICD-10-CM

## 2019-10-13 MED ORDER — LUBIPROSTONE 24 MCG PO CAPS: 24.0000 ug | ORAL_CAPSULE | Freq: Two times a day (BID) | ORAL | 0 refills | Status: DC

## 2019-11-12 ENCOUNTER — Ambulatory Visit: Payer: Medicaid Other | Admitting: Gastroenterology

## 2019-11-12 NOTE — Progress Notes (Deleted)
Gastroenterology Consultation  Referring Provider:     Stephanie Acre* Primary Care Physician:  Berniece Pap, FNP Primary Gastroenterologist:  Dr. Servando Snare     Reason for Consultation:     Constipation and GERD        HPI:   Cody Delgado is a 34 y.o. y/o male referred for consultation & management of Constipation and GERD by Dr. Ashok Norris, Eula Fried, FNP.  This patient comes to see me for referral for a history of constipation with heartburn.  The patient has been seen by [facility] family medicine and has been counseled to use MiraLAX with switching over to Metamucil at the last visit. The patient was also counseled to take Protonix for his heartburn. The patient is being evaluated for increased creatinine.  He was also recommended by his primary care provider to stop his cocaine and marijuana use.  Past Medical History:  Diagnosis Date  . Acid reflux   . Anxiety   . Depression   . Gout   . Neuromuscular disorder [facility])     Past Surgical History:  Procedure Laterality Date  . SMALL INTESTINE SURGERY      Prior to Admission medications   Medication Sig Start Date End Date Taking? Authorizing Provider  baclofen (LIORESAL) 10 MG tablet Take 1 tablet (10 mg total) by mouth 3 (three) times daily. Will cause drowsiness. 09/23/19   Flinchum, Eula Fried, FNP  ketoconazole (NIZORAL) 2 % cream Apply 1 application topically 2 (two) times daily. 09/23/19   Flinchum, Eula Fried, FNP  lubiprostone (AMITIZA) 24 MCG capsule Take 1 capsule (24 mcg total) by mouth 2 (two) times daily with a meal. 10/13/19   Burnette, Alessandra Bevels, PA-C  pantoprazole (PROTONIX) 40 MG tablet Take 1 tablet (40 mg total) by mouth daily. 09/23/19 10/23/19  Flinchum, Eula Fried, FNP  polyethylene glycol powder (GLYCOLAX/MIRALAX) 17 GM/SCOOP powder Take 17 g by mouth daily. Patient not taking: Reported on 09/23/2019 09/02/19   Flinchum, Eula Fried, FNP  psyllium (METAMUCIL SMOOTH TEXTURE) 58.6 % powder Take 1  packet by mouth 3 (three) times daily. 09/02/19   Flinchum, Eula Fried, FNP    No family history on file.   Social History   Tobacco Use  . Smoking status: Current Every Day Smoker    Packs/day: 1.00    Types: Cigarettes  . Smokeless tobacco: Never Used  Vaping Use  . Vaping Use: Never used  Substance Use Topics  . Alcohol use: Yes    Alcohol/week: 25.0 standard drinks    Types: 25 Cans of beer per week    Comment: 20 beers per week  . Drug use: Yes    Types: Marijuana, Cocaine    Allergies as of 11/12/2019  . (No Known Allergies)    Review of Systems:    All systems reviewed and negative except where noted in HPI.   Physical Exam:  There were no vitals taken for this visit. No LMP for male patient. General:   Alert,  Well-developed, well-nourished, pleasant and cooperative in NAD Head:  Normocephalic and atraumatic. Eyes:  Sclera clear, no icterus.   Conjunctiva pink. Ears:  Normal auditory acuity. Neck:  Supple; no masses or thyromegaly. Lungs:  Respirations even and unlabored.  Clear throughout to auscultation.   No wheezes, crackles, or rhonchi. No acute distress. Heart:  Regular rate and rhythm; no murmurs, clicks, rubs, or gallops. Abdomen:  Normal bowel sounds.  No bruits.  Soft, non-tender and non-distended without masses,  hepatosplenomegaly or hernias noted.  No guarding or rebound tenderness.  Negative Carnett sign.   Rectal:  Deferred.  Pulses:  Normal pulses noted. Extremities:  No clubbing or edema.  No cyanosis. Neurologic:  Alert and oriented x3;  grossly normal neurologically. Skin:  Intact without significant lesions or rashes.  No jaundice. Lymph Nodes:  No significant cervical adenopathy. Psych:  Alert and cooperative. Normal mood and affect.  Imaging Studies: No results found.  Assessment and Plan:   Cody Delgado is a 34 y.o. y/o male ***    Midge Minium, MD. Clementeen Graham    Note: This dictation was prepared with Dragon dictation along  with smaller phrase technology. Any transcriptional errors that result from this process are unintentional.

## 2019-11-27 ENCOUNTER — Encounter: Payer: Self-pay | Admitting: Adult Health

## 2019-11-27 DIAGNOSIS — R8290 Unspecified abnormal findings in urine: Secondary | ICD-10-CM | POA: Diagnosis not present

## 2019-11-27 DIAGNOSIS — N1831 Chronic kidney disease, stage 3a: Secondary | ICD-10-CM | POA: Diagnosis not present

## 2019-11-28 ENCOUNTER — Other Ambulatory Visit (HOSPITAL_COMMUNITY): Payer: Self-pay | Admitting: Nephrology

## 2019-11-28 ENCOUNTER — Other Ambulatory Visit: Payer: Self-pay | Admitting: Nephrology

## 2019-11-28 DIAGNOSIS — N183 Chronic kidney disease, stage 3 unspecified: Secondary | ICD-10-CM

## 2019-12-03 DIAGNOSIS — Z03818 Encounter for observation for suspected exposure to other biological agents ruled out: Secondary | ICD-10-CM | POA: Diagnosis not present

## 2019-12-03 DIAGNOSIS — Z1152 Encounter for screening for COVID-19: Secondary | ICD-10-CM | POA: Diagnosis not present

## 2019-12-04 ENCOUNTER — Encounter: Payer: Self-pay | Admitting: Adult Health

## 2019-12-05 ENCOUNTER — Ambulatory Visit: Payer: Self-pay

## 2019-12-06 ENCOUNTER — Ambulatory Visit: Payer: Self-pay

## 2019-12-08 ENCOUNTER — Ambulatory Visit: Payer: Medicaid Other

## 2019-12-12 ENCOUNTER — Ambulatory Visit
Admission: RE | Admit: 2019-12-12 | Discharge: 2019-12-12 | Disposition: A | Discharge status: Home / Self Care | Payer: Medicaid Other | Source: Ambulatory Visit | Attending: Nephrology | Admitting: Nephrology

## 2019-12-12 ENCOUNTER — Other Ambulatory Visit: Payer: Self-pay

## 2019-12-12 DIAGNOSIS — N183 Chronic kidney disease, stage 3 unspecified: Secondary | ICD-10-CM | POA: Insufficient documentation

## 2019-12-15 ENCOUNTER — Encounter: Payer: Self-pay | Admitting: Gastroenterology

## 2019-12-15 ENCOUNTER — Ambulatory Visit: Payer: Medicaid Other | Admitting: Gastroenterology

## 2019-12-15 ENCOUNTER — Other Ambulatory Visit: Payer: Self-pay

## 2019-12-15 VITALS — BP 133/91 | HR 78 | Temp 98.0°F | Ht 72.0 in | Wt 222.1 lb

## 2019-12-15 DIAGNOSIS — K5909 Other constipation: Secondary | ICD-10-CM | POA: Diagnosis not present

## 2019-12-15 NOTE — Progress Notes (Signed)
Cody Repress, MD [address]  [address]  [place], [place] [address]  Main: [phone]  Fax: [phone]    Gastroenterology Consultation  Referring Provider:     Stephanie Acre* Primary Care Physician:  Berniece Pap, FNP Primary Gastroenterologist:  Dr. Arlyss Delgado Reason for Consultation:     Chronic constipation        HPI:   Cody Delgado is a 34 y.o. male referred by Dr. Berniece Pap, FNP  for consultation & management of chronic constipation.  Patient has history of stage III CKD, is concerned about lack of urge to have a bowel movement.  He tried Amitiza in the past which resulted in diarrhea, therefore discontinued it.  He has history of gout, drinks alcohol 4-5 beers daily.  He denies any weight loss, rectal bleeding.  He has tried laxatives which no longer help.  His labs are otherwise unremarkable except for abnormal renal function Patient used to take ibuprofen 2-3 times weekly for gout for several years, until recently, discontinued after recommendation by his nephrologist  NSAIDs: Regularly for gout pain  Antiplts/Anticoagulants/Anti thrombotics: None  GI Procedures: None He denies family history of GI malignancy  Past Medical History:  Diagnosis Date  . Acid reflux   . Anxiety   . Depression   . Gout   . Neuromuscular disorder [facility])     Past Surgical History:  Procedure Laterality Date  . SMALL INTESTINE SURGERY      Current Outpatient Medications:  .  pantoprazole (PROTONIX) 40 MG tablet, Take 1 tablet (40 mg total) by mouth daily., Disp: 90 tablet, Rfl: 0    History reviewed. No pertinent family history.   Social History   Tobacco Use  . Smoking status: Current Every Day Smoker    Packs/day: 1.00    Types: Cigarettes  . Smokeless tobacco: Never Used  Vaping Use  . Vaping Use: Never used  Substance Use Topics  . Alcohol use: Yes    Alcohol/week: 25.0 standard drinks    Types: 25 Cans of beer  per week    Comment: 20 beers per week  . Drug use: Yes    Types: Marijuana, Cocaine    Allergies as of 12/15/2019  . (No Known Allergies)    Review of Systems:    All systems reviewed and negative except where noted in HPI.   Physical Exam:  BP (!) 133/91 (BP Location: Left Arm, Patient Position: Sitting, Cuff Size: Normal)   Pulse 78   Temp 98 F (36.7 C) (Oral)   Ht 6' (1.829 m)   Wt 222 lb 2 oz (100.8 kg)   BMI 30.13 kg/m  No LMP for male patient.  General:   Alert,  Well-developed, well-nourished, pleasant and cooperative in NAD Head:  Normocephalic and atraumatic. Eyes:  Sclera clear, no icterus.   Conjunctiva pink. Ears:  Normal auditory acuity. Nose:  No deformity, discharge, or lesions. Mouth:  No deformity or lesions,oropharynx pink & moist. Neck:  Supple; no masses or thyromegaly. Lungs:  Respirations even and unlabored.  Clear throughout to auscultation.   No wheezes, crackles, or rhonchi. No acute distress. Heart:  Regular rate and rhythm; no murmurs, clicks, rubs, or gallops. Abdomen:  Normal bowel sounds. Soft, non-tender and non-distended without masses, hepatosplenomegaly or hernias noted.  No guarding or rebound tenderness.   Rectal: Not performed Msk:  Symmetrical without gross deformities. Good, equal movement & strength bilaterally. Pulses:  Normal pulses noted. Extremities:  No clubbing or edema.  No cyanosis. Neurologic:  Alert and oriented x3;  grossly normal neurologically. Skin:  Intact without significant lesions or rashes. No jaundice. Lymph Nodes:  No significant cervical adenopathy. Psych:  Alert and cooperative. Normal mood and affect.  Imaging Studies: None  Assessment and Plan:   Cody Delgado is a 34 y.o. African-American male with no significant past medical history, with new diagnosis of stage III CKD likely secondary to long-term NSAID use is seen in consultation for chronic constipation  Chronic constipation Discussed  about high-fiber diet, adequate intake of water Trial of Linzess 290 MCG daily, samples provided Strongly reiterated to avoid alcohol in order to prevent gout attacks and minimize NSAID use   Follow up in 2 to 3 months   Cody Repress, MD

## 2019-12-15 NOTE — Patient Instructions (Signed)

## 2019-12-30 ENCOUNTER — Ambulatory Visit: Payer: Medicaid Other | Admitting: Adult Health

## 2020-01-06 ENCOUNTER — Other Ambulatory Visit: Payer: Self-pay

## 2020-01-06 ENCOUNTER — Encounter: Payer: Self-pay | Admitting: Adult Health

## 2020-01-06 ENCOUNTER — Ambulatory Visit: Payer: Medicaid Other | Admitting: Adult Health

## 2020-01-06 VITALS — BP 128/86 | HR 69 | Temp 97.8°F | Resp 16 | Ht 72.0 in | Wt 223.0 lb

## 2020-01-06 DIAGNOSIS — K219 Gastro-esophageal reflux disease without esophagitis: Secondary | ICD-10-CM

## 2020-01-06 DIAGNOSIS — Z6830 Body mass index (BMI) 30.0-30.9, adult: Secondary | ICD-10-CM | POA: Diagnosis not present

## 2020-01-06 DIAGNOSIS — F17209 Nicotine dependence, unspecified, with unspecified nicotine-induced disorders: Secondary | ICD-10-CM | POA: Diagnosis not present

## 2020-01-06 DIAGNOSIS — M109 Gout, unspecified: Secondary | ICD-10-CM | POA: Diagnosis not present

## 2020-01-06 MED ORDER — PREDNISONE 10 MG PO TABS: 10.0000 mg | ORAL_TABLET | Freq: Every day | ORAL | 0 refills | Status: DC

## 2020-01-06 NOTE — Patient Instructions (Addendum)
Avoid NSAID's. Take tylenol as needed. Watch diet. May try prednisone dose pack - take every other day for course of treatment.     Prednisolone tablets What is this medicine? PREDNISOLONE (pred NISS oh lone) is a corticosteroid. It is commonly used to treat inflammation of the skin, joints, lungs, and other organs. Common conditions treated include asthma, allergies, and arthritis. It is also used for other conditions, such as blood disorders and diseases of the adrenal glands. This medicine may be used for other purposes; ask your health care provider or pharmacist if you have questions. COMMON BRAND NAME(S): Millipred, Millipred DP, Millipred DP 12-Day, Millipred DP 6 Day, Prednoral What should I tell my health care provider before I take this medicine? They need to know if you have any of these conditions:  Cushing's syndrome  diabetes  glaucoma  heart problems or disease  high blood pressure  infection such as herpes, measles, tuberculosis, or chickenpox  kidney disease  liver disease  mental problems  myasthenia gravis  osteoporosis  seizures  stomach ulcer or intestine disease including colitis and diverticulitis  thyroid problem  an unusual or allergic reaction to lactose, prednisolone, other medicines, foods, dyes, or preservatives  pregnant or trying to get pregnant  breast-feeding How should I use this medicine? Take this medicine by mouth with a glass of water. Follow the directions on the prescription label. Take it with food or milk to avoid stomach upset. If you are taking this medicine once a day, take it in the morning. Do not take more medicine than you are told to take. Do not suddenly stop taking your medicine because you may develop a severe reaction. Your doctor will tell you how much medicine to take. If your doctor wants you to stop the medicine, the dose may be slowly lowered over time to avoid any side effects. Talk to your pediatrician  regarding the use of this medicine in children. Special care may be needed. Overdosage: If you think you have taken too much of this medicine contact a poison control center or emergency room at once. NOTE: This medicine is only for you. Do not share this medicine with others. What if I miss a dose? If you miss a dose, take it as soon as you can. If it is almost time for your next dose, take only that dose. Do not take double or extra doses. What may interact with this medicine? Do not take this medicine with any of the following medications:  metyrapone  mifepristone This medicine may also interact with the following medications:  aminoglutethimide  amphotericin B  aspirin and aspirin-like medicines  barbiturates  certain medicines for diabetes, like glipizide or glyburide  cholestyramine  cholinesterase inhibitors  cyclosporine  digoxin  diuretics  ephedrine  male hormones, like estrogens and birth control pills  isoniazid  ketoconazole  NSAIDS, medicines for pain and inflammation, like ibuprofen or naproxen  phenytoin  rifampin  toxoids  vaccines  warfarin This list may not describe all possible interactions. Give your health care provider a list of all the medicines, herbs, non-prescription drugs, or dietary supplements you use. Also tell them if you smoke, drink alcohol, or use illegal drugs. Some items may interact with your medicine. What should I watch for while using this medicine? Visit your doctor or health care professional for regular checks on your progress. If you are taking this medicine over a prolonged period, carry an identification card with your name and address, the type and dose  of your medicine, and your doctor's name and address. This medicine may increase your risk of getting an infection. Tell your doctor or health care professional if you are around anyone with measles or chickenpox, or if you develop sores or blisters that do not  heal properly. If you are going to have surgery, tell your doctor or health care professional that you have taken this medicine within the last twelve months. Ask your doctor or health care professional about your diet. You may need to lower the amount of salt you eat. This medicine may increase blood sugar. Ask your healthcare provider if changes in diet or medicines are needed if you have diabetes. What side effects may I notice from receiving this medicine? Side effects that you should report to your doctor or health care professional as soon as possible:  allergic reactions like skin rash, itching or hives, swelling of the face, lips, or tongue  changes in emotions or moods  eye pain   signs and symptoms of high blood sugar such as being more thirsty or hungry or having to urinate more than normal. You may also feel very tired or have blurry vision.  signs and symptoms of infection like fever or chills; cough; sore throat; pain or trouble passing urine  slow growth in children (if used for longer periods of time)  swelling of ankles, feet  trouble sleeping  weak bones (if used for longer periods of time) Side effects that usually do not require medical attention (report to your doctor or health care professional if they continue or are bothersome):  nausea  skin problems, acne, thin and shiny skin  upset stomach  weight gain This list may not describe all possible side effects. Call your doctor for medical advice about side effects. You may report side effects to FDA at 1-800-FDA-1088. Where should I keep my medicine? Keep out of the reach of children. Store at room temperature between 15 and 30 degrees C (59 and 86 degrees F). Keep container tightly closed. Throw away any unused medicine after the expiration date. NOTE: This sheet is a summary. It may not cover all possible information. If you have questions about this medicine, talk to your doctor, pharmacist, or health  care provider.  2020 Elsevier/Gold Standard (2017-11-08 10:30:56) Gout  Gout is a condition that causes painful swelling of the joints. Gout is a type of inflammation of the joints (arthritis). This condition is caused by having too much uric acid in the body. Uric acid is a chemical that forms when the body breaks down substances called purines. Purines are important for building body proteins. When the body has too much uric acid, sharp crystals can form and build up inside the joints. This causes pain and swelling. Gout attacks can happen quickly and may be very painful (acute gout). Over time, the attacks can affect more joints and become more frequent (chronic gout). Gout can also cause uric acid to build up under the skin and inside the kidneys. What are the causes? This condition is caused by too much uric acid in your blood. This can happen because:  Your kidneys do not remove enough uric acid from your blood. This is the most common cause.  Your body makes too much uric acid. This can happen with some cancers and cancer treatments. It can also occur if your body is breaking down too many red blood cells (hemolytic anemia).  You eat too many foods that are high in purines. These foods  include organ meats and some seafood. Alcohol, especially beer, is also high in purines. A gout attack may be triggered by trauma or stress. What increases the risk? You are more likely to develop this condition if you:  Have a family history of gout.  Are male and middle-aged.  Are male and have gone through menopause.  Are obese.  Frequently drink alcohol, especially beer.  Are dehydrated.  Lose weight too quickly.  Have an organ transplant.  Have lead poisoning.  Take certain medicines, including aspirin, cyclosporine, diuretics, levodopa, and niacin.  Have kidney disease.  Have a skin condition called psoriasis. What are the signs or symptoms? An attack of acute gout happens  quickly. It usually occurs in just one joint. The most common place is the big toe. Attacks often start at night. Other joints that may be affected include joints of the feet, ankle, knee, fingers, wrist, or elbow. Symptoms of this condition may include:  Severe pain.  Warmth.  Swelling.  Stiffness.  Tenderness. The affected joint may be very painful to touch.  Shiny, red, or purple skin.  Chills and fever. Chronic gout may cause symptoms more frequently. More joints may be involved. You may also have white or yellow lumps (tophi) on your hands or feet or in other areas near your joints. How is this diagnosed? This condition is diagnosed based on your symptoms, medical history, and physical exam. You may have tests, such as:  Blood tests to measure uric acid levels.  Removal of joint fluid with a thin needle (aspiration) to look for uric acid crystals.  X-rays to look for joint damage. How is this treated? Treatment for this condition has two phases: treating an acute attack and preventing future attacks. Acute gout treatment may include medicines to reduce pain and swelling, including:  NSAIDs.  Steroids. These are strong anti-inflammatory medicines that can be taken by mouth (orally) or injected into a joint.  Colchicine. This medicine relieves pain and swelling when it is taken soon after an attack. It can be given by mouth or through an IV. Preventive treatment may include:  Daily use of smaller doses of NSAIDs or colchicine.  Use of a medicine that reduces uric acid levels in your blood.  Changes to your diet. You may need to see a dietitian about what to eat and drink to prevent gout. Follow these instructions at home: During a gout attack   If directed, put ice on the affected area: ? Put ice in a plastic bag. ? Place a towel between your skin and the bag. ? Leave the ice on for 20 minutes, 2-3 times a day.  Raise (elevate) the affected joint above the level of  your heart as often as possible.  Rest the joint as much as possible. If the affected joint is in your leg, you may be given crutches to use.  Follow instructions from your health care provider about eating or drinking restrictions. Avoiding future gout attacks  Follow a low-purine diet as told by your dietitian or health care provider. Avoid foods and drinks that are high in purines, including liver, kidney, anchovies, asparagus, herring, mushrooms, mussels, and beer.  Maintain a healthy weight or lose weight if you are overweight. If you want to lose weight, talk with your health care provider. It is important that you do not lose weight too quickly.  Start or maintain an exercise program as told by your health care provider. Eating and drinking  Drink enough fluids  to keep your urine pale yellow.  If you drink alcohol: ? Limit how much you use to:  0-1 drink a day for women.  0-2 drinks a day for men. ? Be aware of how much alcohol is in your drink. In the [place] one drink equals one 12 oz bottle of beer (355 mL) one 5 oz glass of wine (148 mL), or one 1 oz glass of hard liquor (44 mL). General instructions  Take over-the-counter and prescription medicines only as told by your health care provider.  Do not drive or use heavy machinery while taking prescription pain medicine.  Return to your normal activities as told by your health care provider. Ask your health care provider what activities are safe for you.  Keep all follow-up visits as told by your health care provider. This is important. Contact a health care provider if you have:  Another gout attack.  Continuing symptoms of a gout attack after 10 days of treatment.  Side effects from your medicines.  Chills or a fever.  Burning pain when you urinate.  Pain in your lower back or belly. Get help right away if you:  Have severe or uncontrolled pain.  Cannot urinate. Summary  Gout is painful swelling of the  joints caused by inflammation.  The most common site of pain is the big toe, but it can affect other joints in the body.  Medicines and dietary changes can help to prevent and treat gout attacks. This information is not intended to replace advice given to you by your health care provider. Make sure you discuss any questions you have with your health care provider. Document Revised: 08/29/2017 Document Reviewed: 08/29/2017 Elsevier Patient Education  2020 [employer].    Food Basics for Chronic Kidney Disease When your kidneys are not working well, they cannot remove waste and excess substances from your blood as effectively as they did before. This can lead to a buildup and imbalance of these substances, which can worsen kidney damage and affect how your body functions. Certain foods lead to a buildup of these substances in the body. By changing your diet as recommended by your diet and nutrition specialist (dietitian) or health care provider, you could help prevent further kidney damage and delay or prevent the need for dialysis. What are tips for following this plan? General instructions   Work with your health care provider and dietitian to develop a meal plan that is right for you. Foods you can eat, limit, or avoid will be different for each person depending on the stage of kidney disease and any other existing health conditions.  Talk with your health care provider about whether you should take a vitamin and mineral supplement.  Use standard measuring cups and spoons to measure servings of foods. Use a kitchen scale to measure portions of protein foods.  If directed by your health care provider, avoid drinking too much fluid. Measure and count all liquids, including water, ice, soups, flavored gelatin, and frozen desserts such as popsicles or ice cream. Reading food labels  Check the amount of sodium in foods. Choose foods that have less than 300 milligrams (mg) per  serving.  Check the ingredient list for phosphorus or potassium-based additives or preservatives.  Check the amount of saturated and trans fat. Limit or avoid these fats as told by your dietitian. Shopping  Avoid buying foods that are: ? Processed, frozen, or prepackaged. ? Calcium-enriched or fortified.  Do not buy foods that have salt or sodium  listed among the first five ingredients.  Do not buy canned vegetables. Cooking  Replace animal proteins, such as meat, fish, eggs, or dairy, with plant proteins from beans, nuts, and soy. ? Use soy milk instead of cow's milk. ? Add beans or tofu to soups, casseroles, or pasta dishes instead of meat.  Soak vegetables, such as potatoes, before cooking to reduce potassium. To do this: ? Peel and cut into small pieces. ? Soak in warm water for at least 2 hours. For every 1 cup of vegetables, use 10 cups of water. ? Drain and rinse with warm water. ? Boil for at least 5 minutes. Meal planning  Limit the amount of protein from plant and animal sources you eat each day.  Do not add salt to food when cooking or before eating.  Eat meals and snacks at around the same time each day. If you have diabetes:  If you have diabetes (diabetes mellitus) and chronic kidney disease, it is important to keep your blood glucose in the target range recommended by your health care provider. Follow your diabetes management plan. This may include: ? Checking your blood glucose regularly. ? Taking oral medicines, insulin, or both. ? Exercising for at least 30 minutes on 5 or more days each week, or as told by your health care provider. ? Tracking how many servings of carbohydrates you eat at each meal.  You may be given specific guidelines on how much of certain foods and nutrients you may eat, depending on your stage of kidney disease and whether you have high blood pressure (hypertension). Follow your meal plan as told by your dietitian. What nutrients  should be limited? The items listed are not a complete list. Talk with your dietitian about what dietary choices are best for you. Potassium Potassium affects how steadily your heart beats. If too much potassium builds up in your blood, it can cause an irregular heartbeat or even a heart attack. You may need to eat less potassium, depending on your blood potassium levels and the stage of kidney disease. Talk to your dietitian about how much potassium you may have each day. You may need to limit or avoid foods that are high in potassium, such as:  Milk and soy milk.  Fruits, such as bananas, papaya, apricots, nectarines, melon, prunes, raisins, kiwi, and oranges.  Vegetables, such as potatoes, sweet potatoes, yams, tomatoes, leafy greens, beets, okra, avocado, pumpkin, and winter squash.  White and lima beans. Phosphorus Phosphorus is a mineral found in your bones. A balance between calcium and phosphorous is needed to build and maintain healthy bones. Too much phosphorus pulls calcium from your bones. This can make your bones weak and more likely to break. Too much phosphorus can also make your skin itch. You may need to eat less phosphorus depending on your blood phosphorus levels and the stage of kidney disease. Talk to your dietitian about how much potassium you may have each day. You may need to take medicine to lower your blood phosphorus levels if diet changes do not help. You may need to limit or avoid foods that are high in phosphorus, such as:  Milk and dairy products.  Dried beans and peas.  Tofu, soy milk, and other soy-based meat replacements.  Colas.  Nuts and peanut butter.  Meat, poultry, and fish.  Bran cereals and oatmeals. Protein Protein helps you to make and keep muscle. It also helps in the repair of your body's cells and tissues. One of the  natural breakdown products of protein is a waste product called urea. When your kidneys are not working properly, they  cannot remove wastes, such as urea, like they did before you developed chronic kidney disease. Reducing how much protein you eat can help prevent a buildup of urea in your blood. Depending on your stage of kidney disease, you may need to limit foods that are high in protein. Sources of animal protein include:  Meat (all types).  Fish and seafood.  Poultry.  Eggs.  Dairy. Other protein foods include:  Beans and legumes.  Nuts and nut butter.  Soy and tofu. Sodium Sodium, which is found in salt, helps maintain a healthy balance of fluids in your body. Too much sodium can increase your blood pressure and have a negative effect on the function of your heart and lungs. Too much sodium can also cause your body to retain too much fluid, making your kidneys work harder. Most people should have less than 2,300 milligrams (mg) of sodium each day. If you have hypertension, you may need to limit your sodium to 1,500 mg each day. Talk to your dietitian about how much sodium you may have each day. You may need to limit or avoid foods that are high in sodium, such as:  Salt seasonings.  Soy sauce.  Cured and processed meats.  Salted crackers and snack foods.  Fast food.  Canned soups and most canned foods.  Pickled foods.  Vegetable juice.  Boxed mixes or ready-to-eat boxed meals and side dishes.  Bottled dressings, sauces, and marinades. Summary  Chronic kidney disease can lead to a buildup and imbalance of waste and excess substances in the body. Certain foods lead to a buildup of these substances. By adjusting your intake of these foods, you could help prevent more kidney damage and delay or prevent the need for dialysis.  Food adjustments are different for each person with chronic kidney disease. Work with a dietitian to set up nutrient goals and a meal plan that is right for you.  If you have diabetes and chronic kidney disease, it is important to keep your blood glucose in the  target range recommended by your health care provider. This information is not intended to replace advice given to you by your health care provider. Make sure you discuss any questions you have with your health care provider. Document Revised: 05/30/2018 Document Reviewed: 02/02/2016 Elsevier Patient Education  2020 [employer]  Chronic Kidney Disease, Adult Chronic kidney disease (CKD) happens when the kidneys are damaged over a long period of time. The kidneys are two organs that help with:  Getting rid of waste and extra fluid from the blood.  Making hormones that maintain the amount of fluid in your tissues and blood vessels.  Making sure that the body has the right amount of fluids and chemicals. Most of the time, CKD does not go away, but it can usually be controlled. Steps must be taken to slow down the kidney damage or to stop it from getting worse. If this is not done, the kidneys may stop working. Follow these instructions at home: Medicines  Take over-the-counter and prescription medicines only as told by your doctor. You may need to change the amount of medicines you take.  Do not take any new medicines unless your doctor says it is okay. Many medicines can make your kidney damage worse.  Do not take any vitamin and supplements unless your doctor says it is okay. Many vitamins and supplements can  make your kidney damage worse. General instructions  Follow a diet as told by your doctor. You may need to stay away from: ? Alcohol. ? Salty foods. ? Foods that are high in:  Potassium.  Calcium.  Protein.  Do not use any products that contain nicotine or tobacco, such as cigarettes and e-cigarettes. If you need help quitting, ask your doctor.  Keep track of your blood pressure at home. Tell your doctor about any changes.  If you have diabetes, keep track of your blood sugar as told by your doctor.  Try to stay at a healthy weight. If you need help, ask your  doctor.  Exercise at least 30 minutes a day, 5 days a week.  Stay up-to-date with your shots (immunizations) as told by your doctor.  Keep all follow-up visits as told by your doctor. This is important. Contact a doctor if:  Your symptoms get worse.  You have new symptoms. Get help right away if:  You have symptoms of end-stage kidney disease. These may include: ? Headaches. ? Numbness in your hands or feet. ? Easy bruising. ? Having hiccups often. ? Chest pain. ? Shortness of breath. ? Stopping of menstrual periods in women.  You have a fever.  You have very little pee (urine).  You have pain or bleeding when you pee. Summary  Chronic kidney disease (CKD) happens when the kidneys are damaged over a long period of time.  Most of the time, this condition does not go away, but it can usually be controlled. Steps must be taken to slow down the kidney damage or to stop it from getting worse.  Treatment may include a combination of medicines and lifestyle changes. This information is not intended to replace advice given to you by your health care provider. Make sure you discuss any questions you have with your health care provider. Document Revised: 01/19/2017 Document Reviewed: 03/13/2016 Elsevier Patient Education  2020 [employer].

## 2020-01-06 NOTE — Progress Notes (Signed)
Established patient visit   Patient: Cody Delgado   DOB: September 21, 1985   34 y.o. Male  MRN: [id number] Visit Date: 01/06/2020  Today's healthcare provider: Jairo Ben, FNP   Chief Complaint  Patient presents with  . Follow-up  . joint pain   Subjective    HPI  Follow up for consitpation  The patient was last seen for this 3 months ago. Changes made at last visit include starting Miralax.He also seen GI.  Denies any rectal pain or pressure.   He reports good compliance with treatment. He feels that condition is Improved. He is not having side effects.   Dr. Allegra Lai follows GI and Dr. Ella Jubilee. Neurolgy.    Right ankle with gout. He reports he has it occasionally.  Likely due to renal impairment.  He has no pain in leg or thigh. Denies any injury or trauma. Denies any falls.  He has history of gout stopped NSAID's with nephrologist. Has been trying tylenol.    Patient also mentions that he is having possible gout symptoms. He reports that he has had this before. He reports that the pain is in his great toe. Very tender to touch at times. Reports that he can not take NSAIDS due to his kidney issues.   Patient  denies any fever, body aches,chills, rash, chest pain, shortness of breath, nausea, vomiting, or diarrhea.    Patient Active Problem List   Diagnosis Date Noted  . Tobacco abuse 09/23/2019  . Substance abuse (HCC) 09/23/2019  . Strain of left subscapularis muscle 09/23/2019  . Need for hepatitis C screening test 09/23/2019  . Fungal rash of torso- left hand  09/23/2019  . Gastroesophageal reflux disease 09/23/2019  . Elevated serum creatinine 09/23/2019  . Alcohol abuse 09/23/2019  . Drug use 09/02/2019  . Screening for HIV (human immunodeficiency virus) 09/02/2019  . Verbalizes suicidal thoughts 09/02/2019  . Depression 09/02/2019  . Anxiety 09/02/2019  . Constipation 09/02/2019   Past Medical History:  Diagnosis Date  . Acid reflux   .  Anxiety   . Depression   . Gout   . Neuromuscular disorder [facility])    Past Surgical History:  Procedure Laterality Date  . SMALL INTESTINE SURGERY     Social History   Tobacco Use  . Smoking status: Current Every Day Smoker    Packs/day: 1.00    Types: Cigarettes  . Smokeless tobacco: Never Used  Vaping Use  . Vaping Use: Never used  Substance Use Topics  . Alcohol use: Yes    Alcohol/week: 25.0 standard drinks    Types: 25 Cans of beer per week    Comment: 20 beers per week  . Drug use: Yes    Types: Marijuana, Cocaine   Social History   Socioeconomic History  . Marital status: Married    Spouse name: Not on file  . Number of children: Not on file  . Years of education: Not on file  . Highest education level: Not on file  Occupational History  . Not on file  Tobacco Use  . Smoking status: Current Every Day Smoker    Packs/day: 1.00    Types: Cigarettes  . Smokeless tobacco: Never Used  Vaping Use  . Vaping Use: Never used  Substance and Sexual Activity  . Alcohol use: Yes    Alcohol/week: 25.0 standard drinks    Types: 25 Cans of beer per week    Comment: 20 beers per week  . Drug use:  Yes    Types: Marijuana, Cocaine  . Sexual activity: Yes  Other Topics Concern  . Not on file  Social History Narrative  . Not on file   Social Determinants of Health   Financial Resource Strain:   . Difficulty of Paying Living Expenses: Not on file  Food Insecurity:   . Worried About Programme researcher, broadcasting/film/video in the Last Year: Not on file  . Ran Out of Food in the Last Year: Not on file  Transportation Needs:   . Lack of Transportation (Medical): Not on file  . Lack of Transportation (Non-Medical): Not on file  Physical Activity:   . Days of Exercise per Week: Not on file  . Minutes of Exercise per Session: Not on file  Stress:   . Feeling of Stress : Not on file  Social Connections:   . Frequency of Communication with Friends and Family: Not on file  . Frequency of  Social Gatherings with Friends and Family: Not on file  . Attends Religious Services: Not on file  . Active Member of Clubs or Organizations: Not on file  . Attends Banker Meetings: Not on file  . Marital Status: Not on file  Intimate Partner Violence:   . Fear of Current or Ex-Partner: Not on file  . Emotionally Abused: Not on file  . Physically Abused: Not on file  . Sexually Abused: Not on file   No family status information on file.   No family history on file. No Known Allergies     Medications: Outpatient Medications Prior to Visit  Medication Sig  . pantoprazole (PROTONIX) 40 MG tablet Take 1 tablet (40 mg total) by mouth daily.   No facility-administered medications prior to visit.    Review of Systems  Constitutional: Negative.   Respiratory: Negative.   Gastrointestinal: Positive for constipation.  Musculoskeletal: Positive for arthralgias, joint swelling and myalgias.  Neurological: Negative.     Last CBC Lab Results  Component Value Date   WBC 9.1 09/02/2019   HGB 16.0 09/02/2019   HCT 49.3 09/02/2019   MCV 85 09/02/2019   MCH 27.7 09/02/2019   RDW 13.1 09/02/2019   PLT 313 09/02/2019   Last metabolic panel Lab Results  Component Value Date   GLUCOSE 136 (H) 09/02/2019   NA 138 09/02/2019   K 4.3 09/02/2019   CL 100 09/02/2019   CO2 25 09/02/2019   BUN 13 09/02/2019   CREATININE 1.68 (H) 09/02/2019   GFRNONAA 52 (L) 09/02/2019   GFRAA 60 09/02/2019   CALCIUM 10.1 09/02/2019   PROT 7.7 09/02/2019   ALBUMIN 4.8 09/02/2019   LABGLOB 2.9 09/02/2019   AGRATIO 1.7 09/02/2019   BILITOT 0.6 09/02/2019   ALKPHOS 42 (L) 09/02/2019   AST 24 09/02/2019   ALT 20 09/02/2019   ANIONGAP 4 (L) 10/14/2017   Last lipids Lab Results  Component Value Date   CHOL 199 09/02/2019   HDL 58 09/02/2019   LDLCALC 123 (H) 09/02/2019   TRIG 99 09/02/2019   CHOLHDL 3.4 09/02/2019   Last hemoglobin A1c No results found for: HGBA1C Last thyroid  functions Lab Results  Component Value Date   TSH 1.800 09/02/2019   Last vitamin D No results found for: 25OHVITD2, 25OHVITD3, VD25OH Last vitamin B12 and Folate No results found for: VITAMINB12, FOLATE    Objective    BP 128/86   Pulse 69   Temp 97.8 F (36.6 C)   Resp 16   Ht 6' (  1.829 m)   Wt 223 lb (101.2 kg)   BMI 30.24 kg/m  BP Readings from Last 3 Encounters:  01/06/20 128/86  12/15/19 (!) 133/91  09/23/19 124/88   Wt Readings from Last 3 Encounters:  01/06/20 223 lb (101.2 kg)  12/15/19 222 lb 2 oz (100.8 kg)  09/23/19 231 lb (104.8 kg)      Physical Exam Vitals and nursing note reviewed.  Constitutional:      General: He is not in acute distress.    Appearance: Normal appearance. He is well-developed. He is not ill-appearing, toxic-appearing or diaphoretic.     Comments: Patient is alert and oriented and responsive to questions Engages in eye contact with provider. Speaks in full sentences without any pauses without any shortness of breath or distress.    HENT:     Head: Normocephalic and atraumatic.     Right Ear: Hearing, tympanic membrane, ear canal and external ear normal.     Left Ear: Hearing, tympanic membrane, ear canal and external ear normal.     Nose: Nose normal.     Mouth/Throat:     Pharynx: Uvula midline. No oropharyngeal exudate.  Eyes:     General: Lids are normal. No scleral icterus.       Right eye: No discharge.        Left eye: No discharge.     Conjunctiva/sclera: Conjunctivae normal.     Pupils: Pupils are equal, round, and reactive to light.  Neck:     Thyroid: No thyromegaly.     Vascular: Normal carotid pulses. No carotid bruit, hepatojugular reflux or JVD.     Trachea: Trachea and phonation normal. No tracheal tenderness or tracheal deviation.     Meningeal: Brudzinski's sign absent.  Cardiovascular:     Rate and Rhythm: Normal rate and regular rhythm.     Pulses: Normal pulses.          Dorsalis pedis pulses are 2+ on  the right side and 2+ on the left side.       Posterior tibial pulses are 2+ on the right side and 2+ on the left side.     Heart sounds: Normal heart sounds, S1 normal and S2 normal. Heart sounds not distant. No murmur heard.  No friction rub. No gallop.   Pulmonary:     Effort: Pulmonary effort is normal. No accessory muscle usage or respiratory distress.     Breath sounds: Normal breath sounds. No stridor. No wheezing or rales.  Chest:     Chest wall: No tenderness.  Abdominal:     General: Bowel sounds are normal. There is no distension.     Palpations: Abdomen is soft. There is no mass.     Tenderness: There is no abdominal tenderness. There is no guarding or rebound.     Hernia: No hernia is present.  Musculoskeletal:        General: No tenderness or deformity. Normal range of motion.     Cervical back: Full passive range of motion without pain, normal range of motion and neck supple.       Feet:     Comments: Patient moves on and off of exam table and in room without difficulty. Gait is normal in hall and in room. Patient is oriented to person place time and situation. Patient answers questions appropriately and engages in conversation.   Lymphadenopathy:     Head:     Right side of head: No submental, submandibular, tonsillar, preauricular, posterior  auricular or occipital adenopathy.     Left side of head: No submental, submandibular, tonsillar, preauricular, posterior auricular or occipital adenopathy.     Cervical: No cervical adenopathy.  Skin:    General: Skin is warm and dry.     Capillary Refill: Capillary refill takes less than 2 seconds.     Coloration: Skin is not pale.     Findings: No erythema or rash.     Nails: There is no clubbing.  Neurological:     Mental Status: He is alert and oriented to person, place, and time.     GCS: GCS eye subscore is 4. GCS verbal subscore is 5. GCS motor subscore is 6.     Cranial Nerves: No cranial nerve deficit.     Sensory:  No sensory deficit.     Motor: No abnormal muscle tone.     Coordination: Coordination normal.     Gait: Gait normal.     Deep Tendon Reflexes: Reflexes are normal and symmetric. Reflexes normal.  Psychiatric:        Speech: Speech normal.        Behavior: Behavior normal.        Thought Content: Thought content normal.        Judgment: Judgment normal.      No results found for any visits on 01/06/20.  Assessment & Plan    1. Acute gout of right foot, unspecified cause  If needed. Avoid all NSAID'S/ recommend Tylenol for a few days unless worsens as he reports getting this occasionally.  Meds ordered this encounter  Medications  . predniSONE (DELTASONE) 10 MG tablet    Sig: Take 1 tablet (10 mg total) by mouth daily with breakfast.    Dispense:  21 tablet    Refill:  0    2. Tobacco use disorder, continuous Pt was counseled on smoking cessation today for 5 minutes.    3. Body mass index (BMI) of 30.0-30.9 in adult The patient is advised to quit smoking, begin progressive daily aerobic exercise program, follow a low fat, low cholesterol diet, attempt to lose weight, reduce exposure to stress and continue current medications.  4. Gastroesophageal reflux disease, unspecified whether esophagitis present Continue Protonix.   Dep[ressioin score 22, he declines psychiatry or medication, he will reach out should he want recommended psychiatry/ counseling or trial of medication.   PHQ and GAD at next office visit.  Return in about 3 months (around 04/07/2020), or if symptoms worsen or fail to improve, for at any time for any worsening symptoms, Go to Emergency room/ urgent care if worse.  Red Flags discussed. The patient was given clear instructions to go to ER or return to medical center if any red flags develop, symptoms do not improve, worsen or new problems develop. They verbalized understanding.   Addressed acute and or chronic medical problems today requiring 30 minutes  reviewing patients medical record,labs, counseling patient regarding patient's conditions, any medications, answering questions regarding health, and coordination of care as needed. After visit summary patient given copy and reviewed.  Jairo Ben, FNP  [facility] [phone] (phone) [phone] (fax)  [facility]

## 2020-02-25 ENCOUNTER — Ambulatory Visit: Payer: Medicaid Other | Admitting: Gastroenterology

## 2020-03-15 ENCOUNTER — Other Ambulatory Visit: Payer: Self-pay | Admitting: Adult Health

## 2020-03-23 ENCOUNTER — Other Ambulatory Visit: Payer: Self-pay | Admitting: Adult Health

## 2020-03-23 DIAGNOSIS — M109 Gout, unspecified: Secondary | ICD-10-CM

## 2020-03-23 DIAGNOSIS — F1010 Alcohol abuse, uncomplicated: Secondary | ICD-10-CM

## 2020-03-23 MED ORDER — PREDNISONE 10 MG PO TABS: ORAL_TABLET | ORAL | 0 refills | Status: DC

## 2020-04-06 ENCOUNTER — Encounter: Payer: Self-pay | Admitting: Adult Health

## 2020-04-06 ENCOUNTER — Other Ambulatory Visit: Payer: Self-pay

## 2020-04-06 ENCOUNTER — Ambulatory Visit: Payer: Medicaid Other | Admitting: Adult Health

## 2020-04-06 VITALS — BP 149/100 | HR 70 | Temp 96.9°F | Resp 16 | Wt 225.6 lb

## 2020-04-06 DIAGNOSIS — N183 Chronic kidney disease, stage 3 unspecified: Secondary | ICD-10-CM

## 2020-04-06 DIAGNOSIS — M109 Gout, unspecified: Secondary | ICD-10-CM | POA: Diagnosis not present

## 2020-04-06 DIAGNOSIS — F1010 Alcohol abuse, uncomplicated: Secondary | ICD-10-CM | POA: Diagnosis not present

## 2020-04-06 DIAGNOSIS — I10 Essential (primary) hypertension: Secondary | ICD-10-CM

## 2020-04-06 MED ORDER — AMLODIPINE BESYLATE 2.5 MG PO TABS: 2.5000 mg | ORAL_TABLET | Freq: Every day | ORAL | 0 refills | Status: DC

## 2020-04-06 MED ORDER — PREDNISONE 10 MG PO TABS: ORAL_TABLET | ORAL | 0 refills | Status: DC

## 2020-04-06 NOTE — Patient Instructions (Addendum)
Chronic Kidney Disease, Adult Chronic kidney disease is when lasting damage happens to the kidneys slowly over a long time. The kidneys help to:  Make pee (urine).  Make hormones.  Keep the right amount of fluids and chemicals in the body. Most often, this disease does not go away. You must take steps to help keep the kidney damage from getting worse. If steps are not taken, the kidneys might stop working forever. What are the causes?  Diabetes.  High blood pressure.  Diseases that affect the heart and blood vessels.  Other kidney diseases.  Diseases of the body's disease-fighting system.  A problem with the flow of pee.  Infections of the organs that make pee, store it, and take it out of the body.  Swelling or irritation of your blood vessels. What increases the risk?  Getting older.  Having someone in your family who has kidney disease or kidney failure.  Having a disease caused by genes.  Taking medicines often that harm the kidneys.  Being near or having contact with harmful substances.  Being very overweight.  Using tobacco now or in the past. What are the signs or symptoms?  Feeling very tired.  Having a swollen face, legs, ankles, or feet.  Feeling like you may vomit or vomiting.  Not feeling hungry.  Being confused or not able to focus.  Twitches and cramps in the leg muscles or other muscles.  Dry, itchy skin.  A taste of metal in your mouth.  Making less pee, or making more pee.  Shortness of breath.  Trouble sleeping. You may also become anemic or get weak bones. Anemic means there is not enough red blood cells or hemoglobin in your blood. You may get symptoms slowly. You may not notice them until the kidney damage gets very bad. How is this treated? Often, there is no cure for this disease. Treatment can help with symptoms and help keep the disease from getting worse. You may need to:  Avoid alcohol.  Avoid foods that are high in  salt, potassium, phosphorous, and protein.  Take medicines for symptoms and to help control other conditions.  Have dialysis. This treatment gets harmful waste out of your body.  Treat other problems that cause your kidney disease or make it worse. Follow these instructions at home: Medicines  Take over-the-counter and prescription medicines only as told by your doctor.  Do not take any new medicines, vitamins, or supplements unless your doctor says it is okay. Lifestyle  Do not smoke or use any products that contain nicotine or tobacco. If you need help quitting, ask your doctor.  If you drink alcohol: ? Limit how much you use to:  0-1 drink a day for women who are not pregnant.  0-2 drinks a day for men. ? Know how much alcohol is in your drink. In the [place] one drink equals one 12 oz bottle of beer (355 mL), one 5 oz glass of wine (148 mL), or one 1 oz glass of hard liquor (44 mL).  Stay at a healthy weight. If you need help losing weight, ask your doctor.   General instructions  Follow instructions from your doctor about what you cannot eat or drink.  Track your blood pressure at home. Tell your doctor about any changes.  If you have diabetes, track your blood sugar.  Exercise at least 30 minutes a day, 5 days a week.  Keep your shots (vaccinations) up to date.  Keep all follow-up visits.  Where to find more information  [employer] Association of Kidney Patients: ResidentialShow.is  [employer]: www.kidney.org  [employer]: FightingMatch.com.ee  [employer]: www.lifeoptions.org  [employer]: www.kidneyschool.org Contact a doctor if:  Your symptoms get worse.  You get new symptoms. Get help right away if:  You get symptoms of end-stage kidney disease. These include: ? Headaches. ? Losing feeling in your hands or feet. ? Easy bruising. ? Having hiccups often. ? Chest pain. ? Shortness of breath. ? Lack of menstrual periods, in  women.  You have a fever.  You make less pee than normal.  You have pain or you bleed when you pee or poop. These symptoms may be an emergency. Get help right away. Call your local emergency services (911 in the [place]  Do not wait to see if the symptoms will go away.  Do not drive yourself to the hospital. Summary  Chronic kidney disease is when lasting damage happens to the kidneys slowly over a long time.  Causes of this disease include diabetes and high blood pressure.  Often, there is no cure for this disease. Treatment can help symptoms and help keep the disease from getting worse.  Treatment may involve lifestyle changes, medicines, and dialysis. This information is not intended to replace advice given to you by your health care provider. Make sure you discuss any questions you have with your health care provider. Document Revised: 05/14/2019 Document Reviewed: 05/14/2019 Elsevier Patient Education  2021 Elsevier Inc.  Gout  Gout is painful swelling of your joints. Gout is a type of arthritis. It is caused by having too much uric acid in your body. Uric acid is a chemical that is made when your body breaks down substances called purines. If your body has too much uric acid, sharp crystals can form and build up in your joints. This causes pain and swelling. Gout attacks can happen quickly and be very painful (acute gout). Over time, the attacks can affect more joints and happen more often (chronic gout). What are the causes?  Too much uric acid in your blood. This can happen because: ? Your kidneys do not remove enough uric acid from your blood. ? Your body makes too much uric acid. ? You eat too many foods that are high in purines. These foods include organ meats, some seafood, and beer.  Trauma or stress. What increases the risk?  Having a family history of gout.  Being male and middle-aged.  Being male and having gone through menopause.  Being very overweight  (obese).  Drinking alcohol, especially beer.  Not having enough water in the body (being dehydrated).  Losing weight too quickly.  Having an organ transplant.  Having lead poisoning.  Taking certain medicines.  Having kidney disease.  Having a skin condition called psoriasis. What are the signs or symptoms? An attack of acute gout usually happens in just one joint. The most common place is the big toe. Attacks often start at night. Other joints that may be affected include joints of the feet, ankle, knee, fingers, wrist, or elbow. Symptoms of an attack may include:  Very bad pain.  Warmth.  Swelling.  Stiffness.  Shiny, red, or purple skin.  Tenderness. The affected joint may be very painful to touch.  Chills and fever. Chronic gout may cause symptoms more often. More joints may be involved. You may also have white or yellow lumps (tophi) on your hands or feet or in other areas near your joints.  How is this treated?  Treatment for this condition has two phases: treating an acute attack and preventing future attacks.  Acute gout treatment may include: ? NSAIDs. ? Steroids. These are taken by mouth or injected into a joint. ? Colchicine. This medicine relieves pain and swelling. It can be given by mouth or through an IV tube.  Preventive treatment may include: ? Taking small doses of NSAIDs or colchicine daily. ? Using a medicine that reduces uric acid levels in your blood. ? Making changes to your diet. You may need to see a food expert (dietitian) about what to eat and drink to prevent gout. Follow these instructions at home: During a gout attack  If told, put ice on the painful area: ? Put ice in a plastic bag. ? Place a towel between your skin and the bag. ? Leave the ice on for 20 minutes, 2-3 times a day.  Raise (elevate) the painful joint above the level of your heart as often as you can.  Rest the joint as much as possible. If the joint is in your leg,  you may be given crutches.  Follow instructions from your doctor about what you cannot eat or drink.   Avoiding future gout attacks  Eat a low-purine diet. Avoid foods and drinks such as: ? Liver. ? Kidney. ? Anchovies. ? Asparagus. ? Herring. ? Mushrooms. ? Mussels. ? Beer.  Stay at a healthy weight. If you want to lose weight, talk with your doctor. Do not lose weight too fast.  Start or continue an exercise plan as told by your doctor. Eating and drinking  Drink enough fluids to keep your pee (urine) pale yellow.  If you drink alcohol: ? Limit how much you use to:  0-1 drink a day for women.  0-2 drinks a day for men. ? Be aware of how much alcohol is in your drink. In the [place] one drink equals one 12 oz bottle of beer (355 mL), one 5 oz glass of wine (148 mL), or one 1 oz glass of hard liquor (44 mL). General instructions  Take over-the-counter and prescription medicines only as told by your doctor.  Do not drive or use heavy machinery while taking prescription pain medicine.  Return to your normal activities as told by your doctor. Ask your doctor what activities are safe for you.  Keep all follow-up visits as told by your doctor. This is important. Contact a doctor if:  You have another gout attack.  You still have symptoms of a gout attack after 10 days of treatment.  You have problems (side effects) because of your medicines.  You have chills or a fever.  You have burning pain when you pee (urinate).  You have pain in your lower back or belly. Get help right away if:  You have very bad pain.  Your pain cannot be controlled.  You cannot pee. Summary  Gout is painful swelling of the joints.  The most common site of pain is the big toe, but it can affect other joints.  Medicines and avoiding some foods can help to prevent and treat gout attacks. This information is not intended to replace advice given to you by your health care provider. Make  sure you discuss any questions you have with your health care provider. Document Revised: 08/29/2017 Document Reviewed: 08/29/2017 Elsevier Patient Education  2021 Elsevier Inc.  Hypertension, Adult Hypertension is another name for high blood pressure. High blood pressure forces your heart to work  harder to pump blood. This can cause problems over time. There are two numbers in a blood pressure reading. There is a top number (systolic) over a bottom number (diastolic). It is best to have a blood pressure that is below 120/80. Healthy choices can help lower your blood pressure, or you may need medicine to help lower it. What are the causes? The cause of this condition is not known. Some conditions may be related to high blood pressure. What increases the risk?  Smoking.  Having type 2 diabetes mellitus, high cholesterol, or both.  Not getting enough exercise or physical activity.  Being overweight.  Having too much fat, sugar, calories, or salt (sodium) in your diet.  Drinking too much alcohol.  Having long-term (chronic) kidney disease.  Having a family history of high blood pressure.  Age. Risk increases with age.  Race. You may be at higher risk if you are African American.  Gender. Men are at higher risk than women before age 71. After age 40, women are at higher risk than men.  Having obstructive sleep apnea.  Stress. What are the signs or symptoms?  High blood pressure may not cause symptoms. Very high blood pressure (hypertensive crisis) may cause: ? Headache. ? Feelings of worry or nervousness (anxiety). ? Shortness of breath. ? Nosebleed. ? A feeling of being sick to your stomach (nausea). ? Throwing up (vomiting). ? Changes in how you see. ? Very bad chest pain. ? Seizures. How is this treated?  This condition is treated by making healthy lifestyle changes, such as: ? Eating healthy foods. ? Exercising more. ? Drinking less alcohol.  Your health care  provider may prescribe medicine if lifestyle changes are not enough to get your blood pressure under control, and if: ? Your top number is above 130. ? Your bottom number is above 80.  Your personal target blood pressure may vary. Follow these instructions at home: Eating and drinking  If told, follow the DASH eating plan. To follow this plan: ? Fill one half of your plate at each meal with fruits and vegetables. ? Fill one fourth of your plate at each meal with whole grains. Whole grains include whole-wheat pasta, brown rice, and whole-grain bread. ? Eat or drink low-fat dairy products, such as skim milk or low-fat yogurt. ? Fill one fourth of your plate at each meal with low-fat (lean) proteins. Low-fat proteins include fish, chicken without skin, eggs, beans, and tofu. ? Avoid fatty meat, cured and processed meat, or chicken with skin. ? Avoid pre-made or processed food.  Eat less than 1,500 mg of salt each day.  Do not drink alcohol if: ? Your doctor tells you not to drink. ? You are pregnant, may be pregnant, or are planning to become pregnant.  If you drink alcohol: ? Limit how much you use to:  0-1 drink a day for women.  0-2 drinks a day for men. ? Be aware of how much alcohol is in your drink. In the [place] one drink equals one 12 oz bottle of beer (355 mL), one 5 oz glass of wine (148 mL), or one 1 oz glass of hard liquor (44 mL).   Lifestyle  Work with your doctor to stay at a healthy weight or to lose weight. Ask your doctor what the best weight is for you.  Get at least 30 minutes of exercise most days of the week. This may include walking, swimming, or biking.  Get at least 30 minutes of  exercise that strengthens your muscles (resistance exercise) at least 3 days a week. This may include lifting weights or doing Pilates.  Do not use any products that contain nicotine or tobacco, such as cigarettes, e-cigarettes, and chewing tobacco. If you need help quitting, ask  your doctor.  Check your blood pressure at home as told by your doctor.  Keep all follow-up visits as told by your doctor. This is important.   Medicines  Take over-the-counter and prescription medicines only as told by your doctor. Follow directions carefully.  Do not skip doses of blood pressure medicine. The medicine does not work as well if you skip doses. Skipping doses also puts you at risk for problems.  Ask your doctor about side effects or reactions to medicines that you should watch for. Contact a doctor if you:  Think you are having a reaction to the medicine you are taking.  Have headaches that keep coming back (recurring).  Feel dizzy.  Have swelling in your ankles.  Have trouble with your vision. Get help right away if you:  Get a very bad headache.  Start to feel mixed up (confused).  Feel weak or numb.  Feel faint.  Have very bad pain in your: ? Chest. ? Belly (abdomen).  Throw up more than once.  Have trouble breathing. Summary  Hypertension is another name for high blood pressure.  High blood pressure forces your heart to work harder to pump blood.  For most people, a normal blood pressure is less than 120/80.  Making healthy choices can help lower blood pressure. If your blood pressure does not get lower with healthy choices, you may need to take medicine. This information is not intended to replace advice given to you by your health care provider. Make sure you discuss any questions you have with your health care provider. Document Revised: 10/17/2017 Document Reviewed: 10/17/2017 Elsevier Patient Education  2021 Elsevier Inc.  [email or web address].Gov">  Generalized Anxiety Disorder, Adult Generalized anxiety disorder (GAD) is a mental health condition. Unlike normal worries, anxiety related to GAD is not triggered by a specific event. These worries do not fade or get better with time. GAD interferes with relationships, work, and school. GAD  symptoms can vary from mild to severe. People with severe GAD can have intense waves of anxiety with physical symptoms that are similar to panic attacks. What are the causes? The exact cause of GAD is not known, but the following are believed to have an impact:  Differences in natural brain chemicals.  Genes passed down from parents to children.  Differences in the way threats are perceived.  Development during childhood.  Personality. What increases the risk? The following factors may make you more likely to develop this condition:  Being male.  Having a family history of anxiety disorders.  Being very shy.  Experiencing very stressful life events, such as the death of a loved one.  Having a very stressful family environment. What are the signs or symptoms? People with GAD often worry excessively about many things in their lives, such as their health and family. Symptoms may also include:  Mental and emotional symptoms: ? Worrying excessively about natural disasters. ? Fear of being late. ? Difficulty concentrating. ? Fears that others are judging your performance.  Physical symptoms: ? Fatigue. ? Headaches, muscle tension, muscle twitches, trembling, or feeling shaky. ? Feeling like your heart is pounding or beating very fast. ? Feeling out of breath or like you cannot take a deep breath. ?  Having trouble falling asleep or staying asleep, or experiencing restlessness. ? Sweating. ? Nausea, diarrhea, or irritable bowel syndrome (IBS).  Behavioral symptoms: ? Experiencing erratic moods or irritability. ? Avoidance of new situations. ? Avoidance of people. ? Extreme difficulty making decisions. How is this diagnosed? This condition is diagnosed based on your symptoms and medical history. You will also have a physical exam. Your health care provider may perform tests to rule out other possible causes of your symptoms. To be diagnosed with GAD, a person must have  anxiety that:  Is out of his or her control.  Affects several different aspects of his or her life, such as work and relationships.  Causes distress that makes him or her unable to take part in normal activities.  Includes at least three symptoms of GAD, such as restlessness, fatigue, trouble concentrating, irritability, muscle tension, or sleep problems. Before your health care provider can confirm a diagnosis of GAD, these symptoms must be present more days than they are not, and they must last for 6 months or longer. How is this treated? This condition may be treated with:  Medicine. Antidepressant medicine is usually prescribed for long-term daily control. Anti-anxiety medicines may be added in severe cases, especially when panic attacks occur.  Talk therapy (psychotherapy). Certain types of talk therapy can be helpful in treating GAD by providing support, education, and guidance. Options include: ? Cognitive behavioral therapy (CBT). People learn coping skills and self-calming techniques to ease their physical symptoms. They learn to identify unrealistic thoughts and behaviors and to replace them with more appropriate thoughts and behaviors. ? Acceptance and commitment therapy (ACT). This treatment teaches people how to be mindful as a way to cope with unwanted thoughts and feelings. ? Biofeedback. This process trains you to manage your body's response (physiological response) through breathing techniques and relaxation methods. You will work with a therapist while machines are used to monitor your physical symptoms.  Stress management techniques. These include yoga, meditation, and exercise. A mental health specialist can help determine which treatment is best for you. Some people see improvement with one type of therapy. However, other people require a combination of therapies.   Follow these instructions at home: Lifestyle  Maintain a consistent routine and schedule.  Anticipate  stressful situations. Create a plan, and allow extra time to work with your plan.  Practice stress management or self-calming techniques that you have learned from your therapist or your health care provider. General instructions  Take over-the-counter and prescription medicines only as told by your health care provider.  Understand that you are likely to have setbacks. Accept this and be kind to yourself as you persist to take better care of yourself.  Recognize and accept your accomplishments, even if you judge them as small.  Keep all follow-up visits as told by your health care provider. This is important. Contact a health care provider if:  Your symptoms do not get better.  Your symptoms get worse.  You have signs of depression, such as: ? A persistently sad or irritable mood. ? Loss of enjoyment in activities that used to bring you joy. ? Change in weight or eating. ? Changes in sleeping habits. ? Avoiding friends or family members. ? Loss of energy for normal tasks. ? Feelings of guilt or worthlessness. Get help right away if:  You have serious thoughts about hurting yourself or others. If you ever feel like you may hurt yourself or others, or have thoughts about taking your own  life, get help right away. Go to your nearest emergency department or:  Call your local emergency services (911 in the [place]  Call a suicide crisis helpline, such as the National Suicide Prevention Lifeline at 902-713-7331. This is open 24 hours a day in the [place]  Text the Crisis Text Line at 2797686660 (in the [place] Summary  Generalized anxiety disorder (GAD) is a mental health condition that involves worry that is not triggered by a specific event.  People with GAD often worry excessively about many things in their lives, such as their health and family.  GAD may cause symptoms such as restlessness, trouble concentrating, sleep problems, frequent sweating, nausea, diarrhea, headaches, and  trembling or muscle twitching.  A mental health specialist can help determine which treatment is best for you. Some people see improvement with one type of therapy. However, other people require a combination of therapies. This information is not intended to replace advice given to you by your health care provider. Make sure you discuss any questions you have with your health care provider. Document Revised: 11/27/2018 Document Reviewed: 11/27/2018 Elsevier Patient Education  2021 Elsevier Inc.  Substance Abuse Testing Why am I having this test? Substance abuse testing is done to identify the presence of drugs in the body. You may have this test to measure the levels of certain medicines or illegal drugs in your body. Substance abuse testing is most often used by employers and Patent examiner agencies to identify whether a person has used illegal drugs. You may also have this test if you are involved in an accident at work. What is being tested? A substance abuse test may check for:  Medicines that you have been prescribed, such as pain medicine or ADHD medicine.  Illegal drugs, such as heroin, cocaine, amphetamines, and marijuana. What kind of sample is taken? Your health care provider may collect one or more of the following to perform the test:  A urine sample. A test sample is collected by passing urine into a clean cup.  A hair sample. This requires cutting a collection of hair from your head or body that is about the width of a pencil. Hair may be taken from your head, chest, underarms, legs, or face.  A blood sample. This is usually collected by inserting a needle into a blood vessel.         How do I prepare for this test? You may be asked to provide a list of your current prescription medicines. If the test is required for employment or legal reasons, you will be asked to give permission (consent) for the test. Before providing a sample, you may be asked to put all of your  belongings in a locker or other safe location, and you may be asked to wash your hands. Follow the specific directions of the lab or department that is doing the test. Tell a health care provider about:  Any allergies you have.  All medicines you are taking, including vitamins, herbs, eye drops, creams, and over-the-counter medicines.  Any blood disorders you have.  Any surgeries you have had.  Any medical conditions you have.  Whether you are pregnant or may be pregnant. How are the results reported? Your test results will be reported as either positive or negative. What do the results mean? A negative test result means that no drugs or medicines were found in the sample that you provided. A positive test result may mean that you have recently used drugs or taken a  medicine. If your result is positive, more testing will be done to confirm the presence of drugs. Talk with your health care provider or the department that is doing the test about what your results mean. Questions to ask your health care provider Ask your health care provider, or the department that is doing the test:  When will my results be ready?  How will I get my results?  What are my treatment options?  What other tests do I need?  What are my next steps? Summary  Substance abuse testing is done to identify the presence of drugs in the body.  Substance abuse testing is most often used by employers and Patent examiner agencies to identify whether a person has used illegal drugs. You may also have this test if you are involved in an accident at work.  You may be asked to provide a list of your current prescription medicines. If the test is required for employment or legal reasons, you will be asked to give permission (consent) for the test. This information is not intended to replace advice given to you by your health care provider. Make sure you discuss any questions you have with your health care  provider. Document Revised: 05/07/2017 Document Reviewed: 11/06/2016 Elsevier Patient Education  2021 Elsevier Inc.  Food Basics for Chronic Kidney Disease Chronic kidney disease (CKD) occurs when the kidneys are permanently damaged over a long period of time. When your kidneys are not working well, they cannot remove waste, fluids, and other substances from your blood as well as they did before. The substances can build up, which can worsen kidney damage and affect how your body functions. Certain foods lead to a buildup of these substances. By changing your diet, you can help prevent more kidney damage and delay or prevent the need for dialysis. What are tips for following this plan? Reading food labels  Check the amount of salt (sodium) in foods. Choose foods that have less than 300 milligrams (mg) per serving.  Check the ingredient list for phosphorus or potassium-based additives or preservatives.  Check the amount of saturated fat and trans fat. Limit or avoid these fats as told by your dietitian. Shopping  Avoid buying foods that are: ? Processed or prepackaged. ? Calcium-enriched or that have calcium added to them (are fortified).  Do not buy foods that have salt or sodium listed among the first five ingredients.  Buy canned vegetables and beans that say "no salt added" or "low sodium" and rinse them before eating. Cooking  Soak vegetables, such as potatoes, before cooking to reduce potassium. To do this: 1. Peel and cut the vegetables into small pieces. 2. Soak the vegetables in warm water for at least 2 hours. For every 1 cup of vegetables, use 10 cups of water. 3. Drain and rinse the vegetables with warm water. 4. Boil the vegetables for at least 5 minutes. Meal planning  Limit the amount of protein you eat from plant and animal sources each day.  Do not add salt to food when cooking or before eating.  Eat meals and snacks at around the same time each day. General  information  Talk with your health care provider about whether you should take a vitamin and mineral supplement.  Use standard measuring cups and spoons to measure servings of foods. Use a kitchen scale to measure portions of protein foods.  If told by your health care provider, avoid drinking too much fluid. Measure and count all liquids, including water,  ice, soups, flavored gelatin, and frozen desserts such as ice pops or ice cream. If you have diabetes:  If you have diabetes (diabetes mellitus) and CKD, it is important to keep your blood sugar (glucose) in the target range recommended by your health care provider. Follow your diabetes management plan. This may include: ? Checking your blood glucose regularly. ? Taking medicines by mouth, taking insulin, or taking both. ? Exercising for at least 30 minutes on 5 or more days each week, or as told by your health care provider. ? Tracking how many servings of carbohydrates you eat at each meal.  You may be given specific guidelines on how much of certain foods and nutrients you may eat, depending on your stage of kidney disease and whether you have high blood pressure (hypertension). Follow your meal plan as told by your dietitian. What nutrients should I limit? Work with your health care provider and dietitian to develop a meal plan that is right for you. Foods you can eat and foods you should limit or avoid will depend on the stage of your kidney disease and any other health conditions you have. The items listed below are not a complete list. Talk with your dietitian about what dietary choices are best for you. Potassium Potassium affects how steadily your heart beats. If too much potassium builds up in your blood, the potassium can cause an irregular heartbeat or even a heart attack. You may need to limit or avoid foods that are high in potassium, such as:  Milk and soy milk.  Fruits, such as bananas, apricots, nectarines, melon, prunes,  raisins, kiwi, and oranges.  Vegetables, such as potatoes, sweet potatoes, yams, tomatoes, leafy greens, beets, avocado, pumpkin, and winter squash.  White and lima beans.  Whole-wheat breads and pastas.  Beans and nuts. Phosphorus Phosphorus is a mineral found in your bones. A balance between calcium and phosphorus is needed to build and maintain healthy bones. Too much phosphorus pulls calcium from your bones. This can make your bones weak and more likely to break. Too much phosphorus can also make your skin itch. You may need to limit or avoid foods that are high in phosphorus, such as:  Milk and dairy products.  Dried beans and peas.  Tofu, soy milk, and other soy-based meat replacements.  Dark-colored sodas.  Nuts and peanut butter.  Meat, poultry, and fish.  Bran cereals and oatmeal. Protein Protein helps you make and keep muscle. It also helps to repair your body's cells and tissues. One of the natural breakdown products of protein is a waste product called urea. When your kidneys are not working properly, they cannot remove wastes, such as urea. Reducing how much protein you eat can help prevent a buildup of urea in your blood. Depending on your stage of kidney disease, you may need to limit foods that are high in protein. Sources of animal protein include:  Meat (all types).  Fish and seafood.  Poultry.  Eggs.  Dairy. Other protein foods include:  Beans and legumes.  Nuts and nut butter.  Soy and tofu.   Sodium Sodium helps to maintain a healthy balance of fluids in your body. Too much sodium can increase your blood pressure and have a negative effect on your heart and lungs. Too much sodium can also cause your body to retain too much fluid, making your kidneys work harder. Most people should have less than 2,300 mg of sodium each day. If you have hypertension, you may need  to limit your sodium to 1,500 mg each day. You may need to limit or avoid foods that  are high in sodium, such as:  Salt seasonings.  Soy sauce.  Cured and processed meats.  Salted crackers and snack foods.  Fast food.  Canned soups and most canned foods.  Pickled foods.  Vegetable juice.  Boxed mixes or ready-to-eat boxed meals and side dishes.  Bottled dressings, sauces, and marinades. Talk with your dietitian about how much potassium, phosphorus, protein, and sodium you may have each day. Summary  Chronic kidney disease (CKD) can lead to a buildup of waste and extra substances in the body. Certain foods lead to a buildup of these substances. By changing your diet as told, you can help prevent more kidney damage and delay or prevent the need for dialysis.  Food intake changes are different for each person with CKD. Work with a dietitian to set up nutrient goals and a meal plan that is right for you.  If you have diabetes and CKD, it is important to keep your blood sugar in the target range recommended by your health care provider. This information is not intended to replace advice given to you by your health care provider. Make sure you discuss any questions you have with your health care provider. Document Revised: 06/02/2019 Document Reviewed: 06/02/2019 Elsevier Patient Education  2021 [employer].

## 2020-04-06 NOTE — Progress Notes (Signed)
Established patient visit   Patient: Cody BrunnerDavid Anthony Delgado   DOB: 1985/06/29   35 y.o. Male  MRN: [id number] Visit Date: 04/06/2020  Today's healthcare provider: Jairo BenMichelle Smith Lynkin Saini, FNP   Chief Complaint  Patient presents with  . Gastroesophageal Reflux   Subjective    HPI  GERD, Follow up:  The patient was last seen for GERD 4 months ago. Changes made since that visit include none continue on Protonix.  He reports poor compliance with treatment. Patient states since last office visit he discontinued medication because symptoms were resolved He is not having side effects.   He IS experiencing no symptoms. He is NOT experiencing abdominal bloating, belching, chest pain, choking on food, deep pressure at base of neck, difficulty swallowing, dysphagia, fullness after meals, heartburn, laryngitis, midespigastric pain, nausea, need to clear throat frequently, nocturnal burning, regurgitation of undigested food, shortness of breath, upper abdominal discomfort, waterbrash or wheezing  Drinking 3- 4 beers every other night.   Smoking marijuana.    Patient  denies any fever, body aches,chills, rash, chest pain, shortness of breath, nausea, vomiting, or diarrhea.  Denies dizziness, lightheadedness, pre syncopal or syncopal episodes.    He is seeing [facility] for elevated creatinine. Normal renal ultrasound. Chronic kidney disease stage IIIa Likely secondary to chronic ibuprofen use Patient has significant family history of kidney disease. His grandfather is on dialysis. His mother and brother have chronic kidney disease. Urinalysis in our office today is benign Blood pressure and volume are well controlled Recommended to patient to work on his lifestyle choices. Quit smoking Avoid alcohol Avoid ibuprofen/nonsteroidals Avoid substance abuse Consider regular form of exercise Drink at least 30 to 40 ounces of water daily  Goals for next year: Quit smoking  completely Exercise regularly at home  Return in about 1 year (around 11/26/2020).  Mosetta PigeonHarmeet Singh, MD [facility] [address], [place] [address] [place] [address] Ph: [phone] Fax: [phone]    -----------------------------------------------------------------------------------      Medications: Outpatient Medications Prior to Visit  Medication Sig  . [DISCONTINUED] pantoprazole (PROTONIX) 40 MG tablet Take 1 tablet (40 mg total) by mouth daily.  . [DISCONTINUED] predniSONE (DELTASONE) 10 MG tablet PO: Take 6 tablets on day 1:Take 5 tablets day 2:Take 4 tablets day 3: Take 3 tablets day 4:Take 2 tablets day five: 5 Take 1 tablet day 6   No facility-administered medications prior to visit.    Review of Systems  Constitutional: Negative.   HENT: Negative.   Respiratory: Negative.   Gastrointestinal: Negative.   Genitourinary: Negative.   Musculoskeletal: Positive for joint swelling.  Skin: Negative for color change, pallor, rash and wound.  Psychiatric/Behavioral: The patient is nervous/anxious (anxiety - feels he is doing well has baby on the way in 2 weeks. ).   All other systems reviewed and are negative.      Objective    BP (!) 149/100   Pulse 70   Temp (!) 96.9 F (36.1 C) (Oral)   Resp 16   Wt 225 lb 9.6 oz (102.3 kg)   SpO2 100%   BMI 30.60 kg/m  BP Readings from Last 3 Encounters:  04/06/20 (!) 149/100  01/06/20 128/86  12/15/19 (!) 133/91   Wt Readings from Last 3 Encounters:  04/06/20 225 lb 9.6 oz (102.3 kg)  01/06/20 223 lb (101.2 kg)  12/15/19 222 lb 2 oz (100.8 kg)       Physical Exam Nursing note reviewed.  Constitutional:  General: He is not in acute distress.    Appearance: Normal appearance. He is obese. He is not ill-appearing, toxic-appearing or diaphoretic.  HENT:     Head: Normocephalic and atraumatic.     Right Ear: Ear canal normal.     Left Ear: Ear canal normal.     Mouth/Throat:      Mouth: Mucous membranes are moist.  Eyes:     Pupils: Pupils are equal, round, and reactive to light.  Cardiovascular:     Rate and Rhythm: Normal rate and regular rhythm.     Pulses: Normal pulses.          Dorsalis pedis pulses are 2+ on the right side and 2+ on the left side.     Heart sounds: Normal heart sounds.  Abdominal:     General: Bowel sounds are normal. There is no distension.     Tenderness: There is no abdominal tenderness.  Musculoskeletal:        General: Normal range of motion.     Right foot: Normal range of motion. No deformity.     Left foot: Normal range of motion. No deformity.       Feet:  Feet:     Right foot:     Protective Sensation: 4 sites tested. 4 sites sensed.     Skin integrity: Warmth (with moderate edema see note right toe. ) present. No erythema.     Left foot:     Protective Sensation: 4 sites tested. 4 sites sensed.     Skin integrity: No erythema.  Skin:    General: Skin is warm.     Findings: No erythema or rash.  Neurological:     Mental Status: He is oriented to person, place, and time.  Psychiatric:        Mood and Affect: Mood normal.        Behavior: Behavior normal.        Thought Content: Thought content normal.        Judgment: Judgment normal.       No results found for any visits on 04/06/20.  Assessment & Plan     1. Alcohol abuse  Alcohol Use Education Information about Your Drinking Your score on the Alcohol Use Disorders Identification Test was: AUDIT C:    TOTAL AUDIT SCORE:   .  This score places you in the category of:  Score 0 = Abstainers Score 8-19 = Unhealthy/High Risk Drinkers  Score 1-7 = Low Risk Drinkers Score 20+ = Probable Alcohol Dependence   High Scores (20+) on the Alcohol Use Identification Test Consider becoming involved in a structured program.  You should stop drinking if: . You have tried to cut down before but have not been successful, or  . You suffer from morning shakes during a  heavy drinking period, or . You have high blood pressure, or . You are pregnant, or . You have liver disease, or . You are taking medicines that react with alcohol, or . Your alcohol use is affecting your social relationships, or . You have legal consequences like DUIs, or . You call in sick to work, or . You cannot take care of our children, or . Someone close to you says you drink too much    How Much Alcohol is a Drink: Beer: 12 oz. = 1 drink 16 oz. = 1.3 drinks 22 oz. = 2 drinks 40 oz. = 3.3 drinks  Wine: 5 oz. = 1 drink 740 mL (  25 oz.) bottle = 5 drinks Malt Liquor: 12 oz. = 1.5 drinks 16 oz. = 2 drinks 22 oz. = 2.5 drinks 40 oz. = 4.5 drinks  80-Proof Spirits - Hard Liquor: 1 shot = 1 drink 1 mixed drink = number of shots Can equal 1-3 drinks   What is Low-risk Drinking? . Have no more than 2 drinks of alcohol per day . Drink no more than 5 days per week . Do not drink alcohol drink alcohol when: - You drive or operate machinery - You are pregnant or breast feeding - You are taking medications that interact with alcohol - You have medical conditions made worse with alcohol - You can stop or control your drinking      Identify Your Triggers for Drinking . Parties . Particular People . Feeling lonely . Feeling tense . Family problems . Feeling sad . Feeling happy . Feeling bored . After work . Problems sleeping . Criticism . Feelings of failure . After being paid . When others are drinking . In bars . When out for dinner . After arguments . Weekends . Feeling restless . Being in pain   Effects of High-Risk Drinking To the Brain: . Aggressive, irrational behavior . Arguments, violence . Depression, nervousness . Alcohol dependence, memory loss To the Nervous System: . Trembling hands, tingling fingers . Numbness, painful nerves . Impaired sensation leading to falls . Numb tingling toes To Your Lifestyle: . Social, legal, medical  problems . Domestic trouble/relationship loss . Job loss & financial problems . Shortened life span . Accidents and death from drunk driving   To the Face: . Premature aging, drinker's nose . Cancer of the throat & mouth To the Body: . Frequent cold . Reduced resistance to infection . Increased risk of pneumonia . Weakness of heart muscle . Heart failure, anemia . Impaired blood clotting . Breast cancer . Vitamin deficiency, bleeding . Severe Inflammation of the stomach . Vomiting, diarrhea, malnutrition . Ulcer, inflammation of the pancreas . Impaired sexual performance . Birth defects, including deformities, retardation, and low birthweight   Ways to Cope Without Drinking . Go home if you tend to drink after work . Find another activity . Switch to nonalcoholic beverages . Change friends . Join a club . Volunteer . Visit relatives . Plan/take a trip . Go for a walk . Take up a hobby . Listen to music . Talk to a friend . Reading . What would you do if you had no worries about failing?         Good Reasons for Drinking Less . I will live longer - probably 8-10 years. . I will sleep better. . I will be happier. . I will save a lot of money . My relationships will improve. . I will stay younger for longer. . I will achieve more in my life . There will be a greater chance that I will survive to a healthy old age with no premature damage to my brain.  . I will be better at my job. . I will be less likely to feel depressed and commit suicide (6 times less likely). . I will be less likely to die of heart disease or cancer. . Other people will respect me . I will be less likely to get into trouble with the police. . The possibility that I will die of liver disease will be dramatically reduced (12 times less likely). . It will be less likely that I will die in  a car accident (3 times less likely).   Strategies for Cutting Down Keep Track.  Find a way to keep  track of how much you drink.  If you make a note of each drink before you drink it, this will help slow you down. Count and Measure.  Know the standard drink sizes.  Ask the bartender or server about the amount of alcohol in a mixed drink. Set Goals.  Decide how many days a week you will drink and how many drinks each day. Pace and Space.  When you do drink, pace yourself.  Have no more than one drink with alcohol per hour.  Alternate "drink spacers" non-alcoholic drinks such as water, soda, or juice with drinks containing alcohol. Include Food.  Don't drink on an empty stomach.  Have some food so the alcohol will be absorbed more slowly into your system.  Avoid Triggers.  Avoid people, places, or activities that have led to drinking in the past.  Certain times of day or feelings may also be triggers.  Make a plan so you will know what you can do instead of drinking. Plan to Handle Urges.  When an urge hits, consider these options:  Remind yourself of your reasons for changing.  Or talk it through with someone you trust. Or get involved with a healthy, distracting activity.  Or, "urge surf" - instead of fighting the feeling, accept it and ride it out, knowing it will soon crest like a wave and pass. Know Your "No".  Have a polite, convincing "no thanks" for those times when you may be offered a drink and don't want one.  The faster you can say no to these offers, the less likely you are to give in.  If you hesitate, it allows you time to think of excuses to go along.      2. Acute gout of right foot, unspecified cause  - CBC with Differential/Platelet - Comprehensive Metabolic Panel (CMET) - Uric acid - predniSONE (DELTASONE) 10 MG tablet; PO: Take 6 tablets on day 1:Take 5 tablets day 2:Take 4 tablets day 3: Take 3 tablets day 4:Take 2 tablets day five: 5 Take 1 tablet day 6  Dispense: 21 tablet; Refill: 0  3. Stage 3 chronic kidney disease, unspecified whether stage 3a or 3b CKD (HCC) Keep  follow up with Dr. Thedore Mins, blood pressure control also very important as well as diet discussed. Keep recommendations as in HPI Dr. Thedore Mins gave patient we again reviewed.   4. Hypertension, unspecified type DASH diet/ diet and exercise.  - amLODipine (NORVASC) 2.5 MG tablet; Take 1 tablet (2.5 mg total) by mouth daily.  Dispense: 90 tablet; Refill: 0   Recommend discontinuing smoking and marijuana and smoking.   Return in about 1 month (around 05/04/2020), or if symptoms worsen or fail to improve, for at any time for any worsening symptoms, Go to Emergency room/ urgent care if worse.     The entirety of the information documented in the History of Present Illness, Review of Systems and Physical Exam were personally obtained by me. Portions of this information were initially documented by the CMA and reviewed by me for thoroughness and accuracy.   Red Flags discussed. The patient was given clear instructions to go to ER or return to medical center if any red flags develop, symptoms do not improve, worsen or new problems develop. They verbalized understanding.   Jairo Ben, FNP  [facility] [phone] (phone) [phone] (fax)  [facility]

## 2020-04-07 LAB — CBC WITH DIFFERENTIAL/PLATELET
Basophils Absolute: 0.1 10*3/uL (ref 0.0–0.2)
Basos: 1 %
EOS (ABSOLUTE): 0.7 10*3/uL — ABNORMAL HIGH (ref 0.0–0.4)
Eos: 8 %
Hematocrit: 49.8 % (ref 37.5–51.0)
Hemoglobin: 16.3 g/dL (ref 13.0–17.7)
Immature Grans (Abs): 0.1 10*3/uL (ref 0.0–0.1)
Immature Granulocytes: 1 %
Lymphocytes Absolute: 2.9 10*3/uL (ref 0.7–3.1)
Lymphs: 33 %
MCH: 27.9 pg (ref 26.6–33.0)
MCHC: 32.7 g/dL (ref 31.5–35.7)
MCV: 85 fL (ref 79–97)
Monocytes Absolute: 0.6 10*3/uL (ref 0.1–0.9)
Monocytes: 7 %
Neutrophils Absolute: 4.4 10*3/uL (ref 1.4–7.0)
Neutrophils: 50 %
Platelets: 346 10*3/uL (ref 150–450)
RBC: 5.85 x10E6/uL — ABNORMAL HIGH (ref 4.14–5.80)
RDW: 12.5 % (ref 11.6–15.4)
WBC: 8.6 10*3/uL (ref 3.4–10.8)

## 2020-04-07 LAB — URIC ACID: Uric Acid: 10.3 mg/dL — ABNORMAL HIGH (ref 3.8–8.4)

## 2020-04-07 LAB — COMPREHENSIVE METABOLIC PANEL
ALT: 23 IU/L (ref 0–44)
AST: 20 IU/L (ref 0–40)
Albumin/Globulin Ratio: 1.7 (ref 1.2–2.2)
Albumin: 4.5 g/dL (ref 4.0–5.0)
Alkaline Phosphatase: 40 IU/L — ABNORMAL LOW (ref 44–121)
BUN/Creatinine Ratio: 12 (ref 9–20)
BUN: 14 mg/dL (ref 6–20)
Bilirubin Total: 0.4 mg/dL (ref 0.0–1.2)
CO2: 23 mmol/L (ref 20–29)
Calcium: 9.6 mg/dL (ref 8.7–10.2)
Chloride: 100 mmol/L (ref 96–106)
Creatinine, Ser: 1.21 mg/dL (ref 0.76–1.27)
GFR calc Af Amer: 89 mL/min/{1.73_m2} (ref >59)
GFR calc non Af Amer: 77 mL/min/{1.73_m2} (ref >59)
Globulin, Total: 2.7 g/dL (ref 1.5–4.5)
Glucose: 76 mg/dL (ref 65–99)
Potassium: 4.3 mmol/L (ref 3.5–5.2)
Sodium: 138 mmol/L (ref 134–144)
Total Protein: 7.2 g/dL (ref 6.0–8.5)

## 2020-05-19 ENCOUNTER — Encounter: Payer: Self-pay | Admitting: Family Medicine

## 2020-06-29 ENCOUNTER — Encounter: Payer: Self-pay | Admitting: Adult Health

## 2020-06-29 ENCOUNTER — Encounter: Payer: Self-pay | Admitting: Family Medicine

## 2020-06-29 ENCOUNTER — Ambulatory Visit (INDEPENDENT_AMBULATORY_CARE_PROVIDER_SITE_OTHER): Payer: Medicaid Other | Admitting: Family Medicine

## 2020-06-29 ENCOUNTER — Ambulatory Visit
Admission: RE | Admit: 2020-06-29 | Discharge: 2020-06-29 | Disposition: A | Discharge status: Home / Self Care | Payer: Medicaid Other | Source: Ambulatory Visit | Attending: Family Medicine | Admitting: Family Medicine

## 2020-06-29 ENCOUNTER — Other Ambulatory Visit: Payer: Self-pay

## 2020-06-29 ENCOUNTER — Ambulatory Visit
Admission: RE | Admit: 2020-06-29 | Discharge: 2020-06-29 | Disposition: A | Discharge status: Home / Self Care | Payer: Medicaid Other | Attending: Family Medicine | Admitting: Family Medicine

## 2020-06-29 VITALS — BP 122/84 | HR 77 | Temp 97.9°F | Wt 227.0 lb

## 2020-06-29 DIAGNOSIS — I10 Essential (primary) hypertension: Secondary | ICD-10-CM | POA: Diagnosis not present

## 2020-06-29 DIAGNOSIS — N1831 Chronic kidney disease, stage 3a: Secondary | ICD-10-CM | POA: Diagnosis not present

## 2020-06-29 DIAGNOSIS — M25561 Pain in right knee: Secondary | ICD-10-CM

## 2020-06-29 DIAGNOSIS — Z0000 Encounter for general adult medical examination without abnormal findings: Secondary | ICD-10-CM

## 2020-06-29 DIAGNOSIS — M25461 Effusion, right knee: Secondary | ICD-10-CM | POA: Insufficient documentation

## 2020-06-29 DIAGNOSIS — F32A Depression, unspecified: Secondary | ICD-10-CM | POA: Diagnosis not present

## 2020-06-29 DIAGNOSIS — F1010 Alcohol abuse, uncomplicated: Secondary | ICD-10-CM

## 2020-06-29 DIAGNOSIS — F17209 Nicotine dependence, unspecified, with unspecified nicotine-induced disorders: Secondary | ICD-10-CM | POA: Diagnosis not present

## 2020-06-29 DIAGNOSIS — F1910 Other psychoactive substance abuse, uncomplicated: Secondary | ICD-10-CM

## 2020-06-29 MED ORDER — AMLODIPINE BESYLATE 2.5 MG PO TABS: 2.5000 mg | ORAL_TABLET | Freq: Every day | ORAL | 0 refills | Status: DC

## 2020-06-29 NOTE — Progress Notes (Signed)
Complete physical exam   Patient: Cody Delgado   DOB: Aug 12, 1985   35 y.o. Male  MRN: [id number] Visit Date: 06/29/2020  Today's healthcare provider: Dortha Kern, PA-C   No chief complaint on file.  Subjective    [name] is a 35 y.o. male who presents today for a complete physical exam.  He reports consuming a general diet. The patient does not participate in regular exercise at present. He generally feels well. He reports sleeping poorly. He does have additional problems to discuss today.  HPI  Patient needs refill on his Amlodipine.   He is also complaining of right knee pain for about 2 months.  He has been treated in the past for Gout but does not think that is his problem.  He states he has pain, swelling and feels there is fluid in the joint.  He has used Prednisone in the past.  More recently has treated the pain with ice, heat and OTC pain medications.  He has had no known trauma to the knee.  Past Medical History:  Diagnosis Date   Acid reflux    Anxiety    Depression    Gout    Neuromuscular disorder (HCC)    Past Surgical History:  Procedure Laterality Date   SMALL INTESTINE SURGERY     Social History   Socioeconomic History   Marital status: Married    Spouse name: Not on file   Number of children: Not on file   Years of education: Not on file   Highest education level: Not on file  Occupational History   Not on file  Tobacco Use   Smoking status: Current Every Day Smoker    Packs/day: 1.00    Types: Cigarettes   Smokeless tobacco: Never Used  Vaping Use   Vaping Use: Never used  Substance and Sexual Activity   Alcohol use: Yes    Alcohol/week: 25.0 standard drinks    Types: 25 Cans of beer per week    Comment: 20 beers per week   Drug use: Yes    Types: Marijuana, Cocaine   Sexual activity: Yes  Other Topics Concern   Not on file  Social History Narrative   Not on file   Social Determinants of Health   Financial  Resource Strain: Not on file  Food Insecurity: Not on file  Transportation Needs: Not on file  Physical Activity: Not on file  Stress: Not on file  Social Connections: Not on file  Intimate Partner Violence: Not on file   No family status information on file.   No family history on file. No Known Allergies  Patient Care Team: Berniece Pap, FNP as PCP - General (Family Medicine)   Medications: Outpatient Medications Prior to Visit  Medication Sig   amLODipine (NORVASC) 2.5 MG tablet Take 1 tablet (2.5 mg total) by mouth daily.   predniSONE (DELTASONE) 10 MG tablet PO: Take 6 tablets on day 1:Take 5 tablets day 2:Take 4 tablets day 3: Take 3 tablets day 4:Take 2 tablets day five: 5 Take 1 tablet day 6   No facility-administered medications prior to visit.    Review of Systems  Constitutional:  Positive for fatigue.  HENT:  Positive for sinus pressure and sneezing.   Eyes: Negative.   Respiratory: Negative.    Cardiovascular: Negative.   Gastrointestinal:  Positive for diarrhea.  Endocrine: Negative.   Genitourinary:  Positive for frequency.  Musculoskeletal:  Positive for arthralgias and  joint swelling.  Skin: Negative.   Allergic/Immunologic: Negative.   Neurological: Negative.   Hematological: Negative.   Psychiatric/Behavioral:  The patient is nervous/anxious.      Objective    BP 122/84 (BP Location: Right Arm, Patient Position: Sitting, Cuff Size: Normal)   Pulse 77   Temp 97.9 F (36.6 C) (Oral)   Wt 227 lb (103 kg)   SpO2 95%   BMI 30.79 kg/m    Physical Exam Constitutional:      Appearance: Normal appearance. He is normal weight.  HENT:     Head: Normocephalic and atraumatic.     Right Ear: Tympanic membrane, ear canal and external ear normal.     Left Ear: Tympanic membrane, ear canal and external ear normal.     Nose: Nose normal.     Mouth/Throat:     Mouth: Mucous membranes are moist.     Pharynx: Oropharynx is clear.  Eyes:      Extraocular Movements: Extraocular movements intact.     Conjunctiva/sclera: Conjunctivae normal.     Pupils: Pupils are equal, round, and reactive to light.  Cardiovascular:     Rate and Rhythm: Normal rate and regular rhythm.     Pulses: Normal pulses.     Heart sounds: Normal heart sounds.  Pulmonary:     Effort: Pulmonary effort is normal.     Breath sounds: Normal breath sounds.  Abdominal:     General: Abdomen is flat. Bowel sounds are normal.     Palpations: Abdomen is soft.  Musculoskeletal:        General: Normal range of motion.     Cervical back: Normal range of motion and neck supple.  Skin:    General: Skin is warm and dry.  Neurological:     General: No focal deficit present.     Mental Status: He is alert and oriented to person, place, and time. Mental status is at baseline.  Psychiatric:        Mood and Affect: Mood normal.        Behavior: Behavior normal.        Thought Content: Thought content normal.        Judgment: Judgment normal.    Last depression screening scores PHQ 2/9 Scores 09/02/2019  PHQ - 2 Score 6  PHQ- 9 Score 22   Last fall risk screening No flowsheet data found. Last Audit-C alcohol use screening Alcohol Use Disorder Test (AUDIT) 01/06/2020  1. How often do you have a drink containing alcohol? 4  2. How many drinks containing alcohol do you have on a typical day when you are drinking? 1  3. How often do you have six or more drinks on one occasion? 3  AUDIT-C Score 8  4. How often during the last year have you found that you were not able to stop drinking once you had started? 1  5. How often during the last year have you failed to do what was normally expected from you because of drinking? 0  6. How often during the last year have you needed a first drink in the morning to get yourself going after a heavy drinking session? 0  7. How often during the last year have you had a feeling of guilt of remorse after drinking? 1  8. How often  during the last year have you been unable to remember what happened the night before because you had been drinking? 1  9. Have you or someone else been  injured as a result of your drinking? 0  10. Has a relative or friend or a doctor or another health worker been concerned about your drinking or suggested you cut down? 4  Alcohol Use Disorder Identification Test Final Score (AUDIT) 15   A score of 3 or more in women, and 4 or more in men indicates increased risk for alcohol abuse, EXCEPT if all of the points are from question 1   No results found for any visits on 06/29/20.  Assessment & Plan    Routine Health Maintenance and Physical Exam  Exercise Activities and Dietary recommendations Goals   Recommend balanced diet and regular exercise.     Immunization History  Administered Date(s) Administered   [employer] Vaccination 09/02/2019    Health Maintenance  Topic Date Due   COVID-19 Vaccine (2 - Moderna 3-dose series) 09/30/2019   TETANUS/TDAP  09/22/2020 (Originally 03/21/2004)   INFLUENZA VACCINE  09/20/2020   Hepatitis C Screening  Completed   HIV Screening  Completed   HPV VACCINES  Aged Out    Discussed health benefits of physical activity, and encouraged him to engage in regular exercise appropriate for his age and condition.  1. Annual physical exam General health fair. Needs to eliminate cocaine, marijuana and alcohol. Check routine labs. - Lipid Panel With LDL/HDL Ratio - TSH - CBC with Differential/Platelet  2. Hypertension, unspecified type Good control. Tolerating Amlodipine 2.5 mg qd. Should discontinue ETOH and smoking. Check labs and refill medication. - Lipid Panel With LDL/HDL Ratio - TSH - amLODipine (NORVASC) 2.5 MG tablet; Take 1 tablet (2.5 mg total) by mouth daily.  Dispense: 90 tablet; Refill: 0 - CBC with Differential/Platelet  3. Stage 3a chronic kidney disease (HCC) Followed by nephrologist (Dr. Thedore Mins). Should stop cocaine,  marijuana, smoking and ETOH. Recheck labs. - CBC with Differential/Platelet  4. Alcohol abuse Heavy drinking (average 25 beers a week now). Does not feel he gets intoxicated. Counseled regarding cessation and probable need for admission to detoxification/addiction center.  5. Substance abuse (HCC) History of cocaine and marijuana use.  6. Depression, unspecified depression type Sadness without suicidal ideation. Suspect secondary to cocaine and ETOH use. Check for anemia and signs of macrocytosis. - TSH - CBC with Differential/Platelet  7. Tobacco use disorder, continuous Still smoking 1 ppd. Counseled regarding cessation programs.  8. Pain and swelling of right knee Will check x-ray and labs for gout or infection. No redness but some tenderness of the right knee. - Uric acid - CBC with Differential/Platelet - DG Knee Complete 4 Views Right   No follow-ups on file.     I, Velvia Mehrer, PA-C, have reviewed all documentation for this visit. The documentation on 06/29/20 for the exam, diagnosis, procedures, and orders are all accurate and complete.    Dortha Kern, PA-C  [facility] [phone] (phone) [phone] (fax)  [facility]

## 2020-06-30 LAB — LIPID PANEL WITH LDL/HDL RATIO
Cholesterol, Total: 189 mg/dL (ref 100–199)
HDL: 64 mg/dL (ref >39)
LDL Chol Calc (NIH): 113 mg/dL — ABNORMAL HIGH (ref 0–99)
LDL/HDL Ratio: 1.8 ratio (ref 0.0–3.6)
Triglycerides: 65 mg/dL (ref 0–149)
VLDL Cholesterol Cal: 12 mg/dL (ref 5–40)

## 2020-06-30 LAB — CBC WITH DIFFERENTIAL/PLATELET
Basophils Absolute: 0.1 10*3/uL (ref 0.0–0.2)
Basos: 1 %
EOS (ABSOLUTE): 0.6 10*3/uL — ABNORMAL HIGH (ref 0.0–0.4)
Eos: 7 %
Hematocrit: 46 % (ref 37.5–51.0)
Hemoglobin: 15.4 g/dL (ref 13.0–17.7)
Immature Grans (Abs): 0.1 10*3/uL (ref 0.0–0.1)
Immature Granulocytes: 1 %
Lymphocytes Absolute: 2.3 10*3/uL (ref 0.7–3.1)
Lymphs: 30 %
MCH: 28.4 pg (ref 26.6–33.0)
MCHC: 33.5 g/dL (ref 31.5–35.7)
MCV: 85 fL (ref 79–97)
Monocytes Absolute: 0.6 10*3/uL (ref 0.1–0.9)
Monocytes: 7 %
Neutrophils Absolute: 4.1 10*3/uL (ref 1.4–7.0)
Neutrophils: 54 %
Platelets: 291 10*3/uL (ref 150–450)
RBC: 5.43 x10E6/uL (ref 4.14–5.80)
RDW: 13 % (ref 11.6–15.4)
WBC: 7.6 10*3/uL (ref 3.4–10.8)

## 2020-06-30 LAB — URIC ACID: Uric Acid: 10.5 mg/dL — ABNORMAL HIGH (ref 3.8–8.4)

## 2020-06-30 LAB — TSH: TSH: 2.04 u[IU]/mL (ref 0.450–4.500)

## 2020-07-06 ENCOUNTER — Other Ambulatory Visit: Payer: Self-pay

## 2020-07-06 MED ORDER — ALLOPURINOL 100 MG PO TABS: 100.0000 mg | ORAL_TABLET | Freq: Every day | ORAL | 3 refills | Status: AC

## 2020-07-06 MED ORDER — NAPROXEN 375 MG PO TABS: 375.0000 mg | ORAL_TABLET | Freq: Two times a day (BID) | ORAL | 0 refills | Status: DC

## 2020-07-20 ENCOUNTER — Ambulatory Visit (INDEPENDENT_AMBULATORY_CARE_PROVIDER_SITE_OTHER): Payer: Medicaid Other | Admitting: Family Medicine

## 2020-07-20 ENCOUNTER — Other Ambulatory Visit: Payer: Self-pay

## 2020-07-20 DIAGNOSIS — Z5329 Procedure and treatment not carried out because of patient's decision for other reasons: Secondary | ICD-10-CM

## 2020-07-20 NOTE — Progress Notes (Deleted)
     I,April Miller,acting as a Neurosurgeon for [employer], PA-C.,have documented all relevant documentation on the behalf of Dortha Kern, PA-C,as directed by  [employer], PA-C while in the presence of [employer], PA-C.   Established patient visit   Patient: Cody Delgado   DOB: 05/10/1985   35 y.o. Male  MRN: [id number] Visit Date: 07/20/2020  Today's healthcare provider: Dortha Kern, PA-C   No chief complaint on file.  Subjective    HPI  Follow up for gout  The patient was last seen for this 2 weeks ago. Changes made at last visit include; labs checked showing-Uric acid level is very high which indicates gout is probably the problem.-Needs Naproxen 500 mg BID #60 and Allopurinol 100 mg qd #90 & 3 RF to get the gout under control. Any alcohol intake and a diet high in purines can increase the uric acid and gout attacks. Schedule recheck in knee in 10-14 days.  He reports good compliance with treatment. He feels that condition is {improved/worse/unchanged:3041574}. He is not having side effects. none  --------------------------------------------------------------------    {Show patient history (optional):23778::" "}   Medications: Outpatient Medications Prior to Visit  Medication Sig  . allopurinol (ZYLOPRIM) 100 MG tablet Take 1 tablet (100 mg total) by mouth daily.  . naproxen (NAPROSYN) 375 MG tablet Take 1 tablet (375 mg total) by mouth 2 (two) times daily with a meal.  . amLODipine (NORVASC) 2.5 MG tablet Take 1 tablet (2.5 mg total) by mouth daily.   No facility-administered medications prior to visit.    Review of Systems  {Labs  Heme  Chem  Endocrine  Serology  Results Review (optional):23779::" "}   Objective    There were no vitals taken for this visit. {Show previous vital signs (optional):23777::" "}   Physical Exam  ***  No results found for any visits on 07/20/20.  Assessment & Plan     ***  No follow-ups on file.       {provider attestation***:1}   Dortha Kern, Cordelia Poche  [facility] [phone] (phone) [phone] (fax)  [facility]

## 2020-07-27 ENCOUNTER — Ambulatory Visit: Payer: Medicaid Other | Admitting: Family Medicine

## 2020-07-27 ENCOUNTER — Other Ambulatory Visit: Payer: Self-pay

## 2020-07-27 NOTE — Progress Notes (Deleted)
      Established patient visit   Patient: Cody Delgado   DOB: 1985-11-15   35 y.o. Male  MRN: [id number] Visit Date: 07/27/2020  Today's healthcare provider: Dortha Kern, PA-C   No chief complaint on file.  Subjective    HPI  Follow up for gout  The patient was last seen for this 4 weeks ago. Changes made at last visit include start Allopurinol 100 mg daily and Naproxen 375 mg BID.  He reports {excellent/good/fair/poor:19665} compliance with treatment. He feels that condition is {improved/worse/unchanged:3041574}. He {is/is not:21021397} having side effects. ***  -----------------------------------------------------------------------------------------   Patient Active Problem List   Diagnosis Date Noted  . Stage 3 chronic kidney disease (HCC) 04/06/2020  . Body mass index (BMI) of 30.0-30.9 in adult 01/06/2020  . Tobacco use disorder, continuous 01/06/2020  . Acute gout of right foot 01/06/2020  . Tobacco abuse 09/23/2019  . Substance abuse (HCC) 09/23/2019  . Strain of left subscapularis muscle 09/23/2019  . Need for hepatitis C screening test 09/23/2019  . Fungal rash of torso- left hand  09/23/2019  . Gastroesophageal reflux disease 09/23/2019  . Elevated serum creatinine 09/23/2019  . Alcohol abuse 09/23/2019  . Drug use 09/02/2019  . Screening for HIV (human immunodeficiency virus) 09/02/2019  . Verbalizes suicidal thoughts 09/02/2019  . Depression 09/02/2019  . Anxiety 09/02/2019  . Constipation 09/02/2019   Social History   Tobacco Use  . Smoking status: Current Every Day Smoker    Packs/day: 1.00    Types: Cigarettes  . Smokeless tobacco: Never Used  Vaping Use  . Vaping Use: Never used  Substance Use Topics  . Alcohol use: Yes    Alcohol/week: 25.0 standard drinks    Types: 25 Cans of beer per week    Comment: 20 beers per week  . Drug use: Yes    Types: Marijuana, Cocaine   No Known Allergies     Medications: Outpatient  Medications Prior to Visit  Medication Sig  . allopurinol (ZYLOPRIM) 100 MG tablet Take 1 tablet (100 mg total) by mouth daily.  . naproxen (NAPROSYN) 375 MG tablet Take 1 tablet (375 mg total) by mouth 2 (two) times daily with a meal.  . amLODipine (NORVASC) 2.5 MG tablet Take 1 tablet (2.5 mg total) by mouth daily.   No facility-administered medications prior to visit.    Review of Systems  Constitutional: Negative for appetite change and fatigue.  Respiratory: Negative for chest tightness and shortness of breath.   Cardiovascular: Negative for chest pain and palpitations.        Objective    There were no vitals taken for this visit. BP Readings from Last 3 Encounters:  06/29/20 122/84  04/06/20 (!) 149/100  01/06/20 128/86   Wt Readings from Last 3 Encounters:  06/29/20 227 lb (103 kg)  04/06/20 225 lb 9.6 oz (102.3 kg)  01/06/20 223 lb (101.2 kg)       Physical Exam  ***  No results found for any visits on 07/27/20.  Assessment & Plan     ***  No follow-ups on file.      {provider attestation***:1}   Dortha Kern, Cordelia Poche  [facility] [phone] (phone) [phone] (fax)  [facility]

## 2020-10-12 ENCOUNTER — Other Ambulatory Visit: Payer: Self-pay | Admitting: Family Medicine

## 2020-10-12 DIAGNOSIS — I10 Essential (primary) hypertension: Secondary | ICD-10-CM

## 2020-10-13 ENCOUNTER — Encounter: Payer: Self-pay | Admitting: Family Medicine

## 2020-10-13 ENCOUNTER — Other Ambulatory Visit: Payer: Self-pay | Admitting: Family Medicine

## 2020-10-13 DIAGNOSIS — F1910 Other psychoactive substance abuse, uncomplicated: Secondary | ICD-10-CM

## 2020-10-13 DIAGNOSIS — F32A Depression, unspecified: Secondary | ICD-10-CM

## 2020-10-13 DIAGNOSIS — F1010 Alcohol abuse, uncomplicated: Secondary | ICD-10-CM

## 2020-10-13 MED ORDER — AMLODIPINE BESYLATE 2.5 MG PO TABS: 2.5000 mg | ORAL_TABLET | Freq: Every day | ORAL | 0 refills | Status: DC

## 2020-11-08 DIAGNOSIS — Z5181 Encounter for therapeutic drug level monitoring: Secondary | ICD-10-CM | POA: Diagnosis not present

## 2020-11-08 DIAGNOSIS — F331 Major depressive disorder, recurrent, moderate: Secondary | ICD-10-CM | POA: Diagnosis not present

## 2020-11-08 DIAGNOSIS — F411 Generalized anxiety disorder: Secondary | ICD-10-CM | POA: Diagnosis not present

## 2020-11-08 DIAGNOSIS — F410 Panic disorder [episodic paroxysmal anxiety] without agoraphobia: Secondary | ICD-10-CM | POA: Diagnosis not present

## 2020-12-12 ENCOUNTER — Other Ambulatory Visit: Payer: Self-pay

## 2020-12-12 ENCOUNTER — Ambulatory Visit
Admission: RE | Admit: 2020-12-12 | Discharge: 2020-12-12 | Disposition: A | Discharge status: Home / Self Care | Payer: Medicaid Other | Source: Ambulatory Visit | Attending: Internal Medicine | Admitting: Internal Medicine

## 2020-12-12 VITALS — BP 128/94 | HR 99 | Temp 98.4°F | Resp 14 | Ht 72.0 in | Wt 230.0 lb

## 2020-12-12 DIAGNOSIS — M10061 Idiopathic gout, right knee: Secondary | ICD-10-CM

## 2020-12-12 MED ORDER — PREDNISONE 10 MG PO TABS: 30.0000 mg | ORAL_TABLET | Freq: Every day | ORAL | 0 refills | Status: AC

## 2020-12-12 NOTE — Discharge Instructions (Addendum)
Please take your medications as prescribed Take Tylenol as needed for pain Icing of the right knee Gentle range of motion exercises If symptoms worsen please return to urgent care to be reevaluated.

## 2020-12-12 NOTE — ED Triage Notes (Signed)
Patient c/o right knee pain that started 2-3 days ago.  Patient reports history of gout.  Patient states that it first started in his left foot.  Patient denies any injury.

## 2020-12-12 NOTE — ED Provider Notes (Signed)
[facility]    CSN: [id number] Arrival date & time: 12/12/20  1305      History   Chief Complaint Chief Complaint  Patient presents with   Knee Pain    right   Appointment    HPI Cody Delgado is a 35 y.o. male comes to the urgent care with left knee pain of 3 days duration.  Patient has a history of gout and is supposed to be taking allopurinol.  At outset of his symptoms he started taking allopurinol.  Since then his symptoms have worsened.  Pain is severe, throbbing, aggravated by bearing weight and denies any known relieving factors.  Pain is associated with swelling of the right knee.  No fever or chills.  No falls or trauma to the right knee.  HPI  Past Medical History:  Diagnosis Date   Acid reflux    Anxiety    Depression    Gout    Neuromuscular disorder [facility])     Patient Active Problem List   Diagnosis Date Noted   Stage 3 chronic kidney disease (HCC) 04/06/2020   Body mass index (BMI) of 30.0-30.9 in adult 01/06/2020   Tobacco use disorder, continuous 01/06/2020   Acute gout of right foot 01/06/2020   Tobacco abuse 09/23/2019   Substance abuse (HCC) 09/23/2019   Strain of left subscapularis muscle 09/23/2019   Need for hepatitis C screening test 09/23/2019   Fungal rash of torso- left hand  09/23/2019   Gastroesophageal reflux disease 09/23/2019   Elevated serum creatinine 09/23/2019   Alcohol abuse 09/23/2019   Drug use 09/02/2019   Screening for HIV (human immunodeficiency virus) 09/02/2019   Verbalizes suicidal thoughts 09/02/2019   Depression 09/02/2019   Anxiety 09/02/2019   Constipation 09/02/2019    Past Surgical History:  Procedure Laterality Date   SMALL INTESTINE SURGERY         Home Medications    Prior to Admission medications   Medication Sig Start Date End Date Taking? Authorizing Provider  allopurinol (ZYLOPRIM) 100 MG tablet Take 1 tablet (100 mg total) by mouth daily. 07/06/20  Yes Chrismon, Jodell Cipro,  PA-C  amLODipine (NORVASC) 2.5 MG tablet Take 1 tablet (2.5 mg total) by mouth daily. 10/13/20  Yes Chrismon, Jodell Cipro, PA-C  predniSONE (DELTASONE) 10 MG tablet Take 3 tablets (30 mg total) by mouth daily for 7 days. 12/12/20 12/19/20 Yes Shelsie Tijerino, Britta Mccreedy, MD    Family History History reviewed. No pertinent family history.  Social History Social History   Tobacco Use   Smoking status: Every Day    Packs/day: 1.00    Types: Cigarettes   Smokeless tobacco: Never  Vaping Use   Vaping Use: Never used  Substance Use Topics   Alcohol use: Yes    Alcohol/week: 25.0 standard drinks    Types: 25 Cans of beer per week    Comment: 20 beers per week   Drug use: Yes    Types: Marijuana, Cocaine     Allergies   Patient has no known allergies.   Review of Systems Review of Systems  Constitutional:  Negative for chills, fatigue and fever.  Gastrointestinal: Negative.   Musculoskeletal:  Positive for arthralgias and joint swelling. Negative for myalgias, neck pain and neck stiffness.    Physical Exam Triage Vital Signs ED Triage Vitals  Enc Vitals Group     BP 12/12/20 1329 (!) 128/94     Pulse Rate 12/12/20 1329 99     Resp 12/12/20  1329 14     Temp 12/12/20 1329 98.4 F (36.9 C)     Temp Source 12/12/20 1329 Oral     SpO2 12/12/20 1329 98 %     Weight 12/12/20 1327 230 lb (104.3 kg)     Height 12/12/20 1327 6' (1.829 m)     Head Circumference --      Peak Flow --      Pain Score 12/12/20 1327 6     Pain Loc --      Pain Edu? --      Excl. in GC? --    No data found.  Updated Vital Signs BP (!) 128/94 (BP Location: Left Arm)   Pulse 99   Temp 98.4 F (36.9 C) (Oral)   Resp 14   Ht 6' (1.829 m)   Wt 104.3 kg   SpO2 98%   BMI 31.19 kg/m   Visual Acuity Right Eye Distance:   Left Eye Distance:   Bilateral Distance:    Right Eye Near:   Left Eye Near:    Bilateral Near:     Physical Exam Vitals and nursing note reviewed.  Constitutional:      General:  He is not in acute distress.    Appearance: He is not ill-appearing.  Cardiovascular:     Rate and Rhythm: Normal rate and regular rhythm.     Pulses: Normal pulses.     Heart sounds: Normal heart sounds.  Pulmonary:     Effort: Pulmonary effort is normal.     Breath sounds: Normal breath sounds.  Musculoskeletal:        General: Swelling and tenderness present. No deformity or signs of injury.     Comments: Range of motion is limited because of pain  Neurological:     Mental Status: He is alert.     UC Treatments / Results  Labs (all labs ordered are listed, but only abnormal results are displayed) Labs Reviewed - No data to display  EKG   Radiology No results found.  Procedures Procedures (including critical care time)  Medications Ordered in UC Medications - No data to display  Initial Impression / Assessment and Plan / UC Course  I have reviewed the triage vital signs and the nursing notes.  Pertinent labs & imaging results that were available during my care of the patient were reviewed by me and considered in my medical decision making (see chart for details).     1.  Acute idiopathic gout of the right knee: Prednisone 30 mg orally daily for 7 days Patient is advised to be compliant with allopurinol Icing of the right knee Gentle range of motion exercises Return to urgent care if symptoms worsen. Final Clinical Impressions(s) / UC Diagnoses   Final diagnoses:  Acute idiopathic gout of right knee     Discharge Instructions      Please take your medications as prescribed Take Tylenol as needed for pain Icing of the right knee Gentle range of motion exercises If symptoms worsen please return to urgent care to be reevaluated.   ED Prescriptions     Medication Sig Dispense Auth. Provider   predniSONE (DELTASONE) 10 MG tablet Take 3 tablets (30 mg total) by mouth daily for 7 days. 21 tablet Ta Fair, Britta Mccreedy, MD      PDMP not reviewed this  encounter.   Merrilee Jansky, MD 12/12/20 [phone]

## 2020-12-17 ENCOUNTER — Ambulatory Visit: Payer: Self-pay | Admitting: *Deleted

## 2020-12-17 NOTE — Telephone Encounter (Signed)
Reviewed over nurse triage note below with patient and encouraged patient to go to [facility] in clinic hours for evaluation and advised him of [facility] urgent care hours during weekday and weekend hours, patient verbalized understanding and states he will go

## 2020-12-17 NOTE — Telephone Encounter (Signed)
Patient c/o right knee swelling , pain , redness, hot to touch and pain behind knee. C/o gout in knee. Was recently seen in UC for gout in left foot and patient reports he thinks gout in right knee. Swelling double the size of left knee. Swelling in right knee since 12/09/20. Barely able to walk on right knee. Patient reports he took last prednisone prescribed by UC yesterday. Reports UC told patient not to take allopurinol for gout flare ups. Denies, chest pain , difficulty breathing, pain in calf or thigh.Recommended UC or [facility] or to go to ED for evaluation. Care advise given. Patient verbalized understanding of care advise and to call back or go to [facility] or ED.

## 2020-12-17 NOTE — Telephone Encounter (Signed)
Reason for Disposition  [1] Very swollen joint AND [2] no fever  Answer Assessment - Initial Assessment Questions 1. LOCATION: "Where is the swelling located?"  (e.g., left, right, both knees)     Right knee 2. SIZE and DESCRIPTION: "What does the swelling look like?"  (e.g., entire knee, localized)     Entire knee double the size  3. ONSET: "When did the swelling start?" "Does it come and go, or is it there all the time?"     Thursday 12/09/20 4. PAIN: "Is there any pain?" If Yes, ask: "How bad is it?" (Scale 1-10; or mild, moderate, severe)     Yes , moderate sitting severe walking  5. SETTING: "Has there been any recent work, exercise or other activity that involved that part of the body?"      No , gout in left foot and now right knee  6. AGGRAVATING FACTORS: "What makes the knee swelling worse?" (e.g., walking, climbing stairs, running)     Walking  7. ASSOCIATED SYMPTOMS: "Is there any pain or redness?"     Pain , redness, bruising , hot to touch  8. OTHER SYMPTOMS: "Do you have any other symptoms?" (e.g., chest pain, difficulty breathing, fever, calf pain)     No  9. PREGNANCY: "Is there any chance you are pregnant?" "When was your last menstrual period?"     na  Protocols used: Knee Swelling-A-AH

## 2020-12-17 NOTE — Telephone Encounter (Signed)
Pt. Reports "I called [facility] and they can't see me until December. They said they cannot do lab work for uric acid level." Pain right knee. States he has a history of gout.Appointment made for next week in the practice. Will consider going to UC this weekend.

## 2020-12-18 ENCOUNTER — Ambulatory Visit: Payer: Self-pay

## 2020-12-19 ENCOUNTER — Ambulatory Visit: Payer: Self-pay

## 2020-12-19 DIAGNOSIS — M25461 Effusion, right knee: Secondary | ICD-10-CM | POA: Diagnosis not present

## 2020-12-19 DIAGNOSIS — M25561 Pain in right knee: Secondary | ICD-10-CM | POA: Diagnosis not present

## 2020-12-21 ENCOUNTER — Telehealth: Payer: Self-pay

## 2020-12-21 NOTE — Telephone Encounter (Signed)
.  Transition Care Management Follow-up Telephone Call Date of discharge and from where: 12/20/2020-[facility]  How have you been since you were released from the hospital? Patient stated he is doing fine. Any questions or concerns? No  Items Reviewed: Did the pt receive and understand the discharge instructions provided? Yes  Medications obtained and verified? Yes  Other? No  Any new allergies since your discharge? No  Dietary orders reviewed? No Do you have support at home? Yes   Home Care and Equipment/Supplies: Were home health services ordered? not applicable If so, what is the name of the agency? N/A  Has the agency set up a time to come to the patient's home? not applicable Were any new equipment or medical supplies ordered?  No What is the name of the medical supply agency? N/A Were you able to get the supplies/equipment? not applicable Do you have any questions related to the use of the equipment or supplies? No  Functional Questionnaire: (I = Independent and D = Dependent) ADLs: I  Bathing/Dressing- I  Meal Prep- I  Eating- I  Maintaining continence- I  Transferring/Ambulation- I  Managing Meds- I  Follow up appointments reviewed:  PCP Hospital f/u appt confirmed? Yes  Scheduled to see Dr. Sherrie Mustache on 12/22/2020 @ 10:20am. Specialist Hospital f/u appt confirmed? Yes  Scheduled to see Ortho on 01/11/2021 @ 2:40pm. Are transportation arrangements needed? No  If their condition worsens, is the pt aware to call PCP or go to the Emergency Dept.? Yes Was the patient provided with contact information for the PCP's office or ED? Yes Was to pt encouraged to call back with questions or concerns? Yes

## 2020-12-22 ENCOUNTER — Encounter: Payer: Self-pay | Admitting: Family Medicine

## 2020-12-22 ENCOUNTER — Ambulatory Visit (INDEPENDENT_AMBULATORY_CARE_PROVIDER_SITE_OTHER): Payer: Medicaid Other | Admitting: Family Medicine

## 2020-12-22 ENCOUNTER — Other Ambulatory Visit: Payer: Self-pay

## 2020-12-22 VITALS — BP 123/83 | HR 87 | Temp 98.1°F | Wt 230.0 lb

## 2020-12-22 DIAGNOSIS — M25461 Effusion, right knee: Secondary | ICD-10-CM | POA: Diagnosis not present

## 2020-12-22 DIAGNOSIS — M25561 Pain in right knee: Secondary | ICD-10-CM

## 2020-12-22 NOTE — Progress Notes (Signed)
      Established patient visit   Patient: Cody Delgado   DOB: 06/11/1985   35 y.o. Male  MRN: [id number] Visit Date: 12/22/2020  Today's healthcare provider: Mila Merry, MD   Chief Complaint  Patient presents with   Knee Pain   Subjective    Knee Pain  There was no injury mechanism (Pain started 12/10/2020.  Pt went to [facility] 12/11/2020 and was treated for gout with 7 day course of prednisone.). The pain is present in the right knee. The pain has been Constant since onset. Associated symptoms include an inability to bear weight and a loss of motion. Pertinent negatives include no loss of sensation, muscle weakness, numbness or tingling. The symptoms are aggravated by weight bearing and movement. Treatments tried: diclofenac gel, colchicine, prednisone, oxycodone. He had aspiration  done 12/19/2020 at [facility] ED. Xray on 12/19/20 was remarkable only for diffuse soft tissue swelling. Aspirated contained many RBCs and WBCs, but no crystals. The treatment provided mild relief.    He is noted to have uric acid level of 10.5 on 06/29/2020 and had not been taking allopurinol consistently prior to the onset of current episode of knee pain.     Medications: Outpatient Medications Prior to Visit  Medication Sig   amLODipine (NORVASC) 2.5 MG tablet Take 1 tablet (2.5 mg total) by mouth daily.   colchicine 0.6 MG tablet Take 0.6 mg by mouth daily.   diclofenac Sodium (VOLTAREN) 1 % GEL Apply 2 g topically 4 (four) times daily.   oxyCODONE (OXY IR/ROXICODONE) 5 MG immediate release tablet Take 5 mg by mouth every 8 (eight) hours as needed.   propranolol (INDERAL) 10 MG tablet Take 10 mg by mouth 3 (three) times daily.   sertraline (ZOLOFT) 50 MG tablet Take 50 mg by mouth daily.   allopurinol (ZYLOPRIM) 100 MG tablet Take 1 tablet (100 mg total) by mouth daily. (Patient not taking: Reported on 12/22/2020)   No facility-administered medications prior to visit.    Review of Systems   Constitutional: Negative.   Musculoskeletal:  Positive for arthralgias, gait problem and joint swelling. Negative for back pain, myalgias, neck pain and neck stiffness.  Neurological:  Negative for numbness.      Objective    BP 123/83 (BP Location: Right Arm, Patient Position: Sitting, Cuff Size: Large)   Pulse 87   Temp 98.1 F (36.7 C) (Oral)   Wt 230 lb (104.3 kg) Comment: Per pt  SpO2 98%   BMI 31.19 kg/m    Physical Exam  Moderately swollen, tender warm right knee. Minimal erythema.   No results found for any visits on 12/22/20.  Assessment & Plan     1. Pain and swelling of right knee Initially diagnosed at urgent care as gout, but he had no improvement with prednisone, aspirate showed blood cells but no crystals, and has no improved any more since starting colchicine 12-19-2020. I don't see any culture results from aspirate that was done at urgent care. Needs urgent orthopedic evaluation to rule out septic knee joint.  - Ambulatory referral to Orthopedic Surgery      The entirety of the information documented in the History of Present Illness, Review of Systems and Physical Exam were personally obtained by me. Portions of this information were initially documented by the CMA and reviewed by me for thoroughness and accuracy.     Mila Merry, MD  [facility] [phone] (phone) [phone] (fax)  [facility]

## 2020-12-23 DIAGNOSIS — M25461 Effusion, right knee: Secondary | ICD-10-CM | POA: Diagnosis not present

## 2020-12-29 DIAGNOSIS — M23611 Other spontaneous disruption of anterior cruciate ligament of right knee: Secondary | ICD-10-CM | POA: Diagnosis not present

## 2020-12-29 DIAGNOSIS — M23351 Other meniscus derangements, posterior horn of lateral meniscus, right knee: Secondary | ICD-10-CM | POA: Diagnosis not present

## 2020-12-29 DIAGNOSIS — M23631 Other spontaneous disruption of medial collateral ligament of right knee: Secondary | ICD-10-CM | POA: Diagnosis not present

## 2020-12-29 DIAGNOSIS — M25461 Effusion, right knee: Secondary | ICD-10-CM | POA: Diagnosis not present

## 2020-12-29 DIAGNOSIS — M2241 Chondromalacia patellae, right knee: Secondary | ICD-10-CM | POA: Diagnosis not present

## 2020-12-30 DIAGNOSIS — M25461 Effusion, right knee: Secondary | ICD-10-CM | POA: Diagnosis not present

## 2020-12-30 DIAGNOSIS — M25469 Effusion, unspecified knee: Secondary | ICD-10-CM | POA: Diagnosis not present

## 2021-01-03 ENCOUNTER — Other Ambulatory Visit: Payer: Self-pay | Admitting: Family Medicine

## 2021-01-03 DIAGNOSIS — M25561 Pain in right knee: Secondary | ICD-10-CM

## 2021-01-03 DIAGNOSIS — M25461 Effusion, right knee: Secondary | ICD-10-CM

## 2021-01-14 NOTE — Progress Notes (Signed)
No show

## 2021-01-17 ENCOUNTER — Encounter: Payer: Self-pay | Admitting: Family Medicine

## 2021-01-24 DIAGNOSIS — M25461 Effusion, right knee: Secondary | ICD-10-CM | POA: Diagnosis not present

## 2021-03-07 ENCOUNTER — Encounter: Payer: Self-pay | Admitting: Family Medicine

## 2021-03-08 ENCOUNTER — Other Ambulatory Visit: Payer: Self-pay

## 2021-03-08 DIAGNOSIS — I10 Essential (primary) hypertension: Secondary | ICD-10-CM

## 2021-03-08 MED ORDER — AMLODIPINE BESYLATE 2.5 MG PO TABS: 2.5000 mg | ORAL_TABLET | Freq: Every day | ORAL | 0 refills | Status: DC

## 2021-03-21 IMAGING — CR RIGHT FOOT COMPLETE - 3+ VIEW
1 series · 3 of 3 positions shown · non-contrast
Comparison: None.

CLINICAL DATA: Right foot pain.  No known injury.

EXAM:
RIGHT FOOT COMPLETE - 3+ VIEW

[Series 1: dg foot complete right · 0.14mm/px · 3 of 3 slices shown]
[im 1/3]
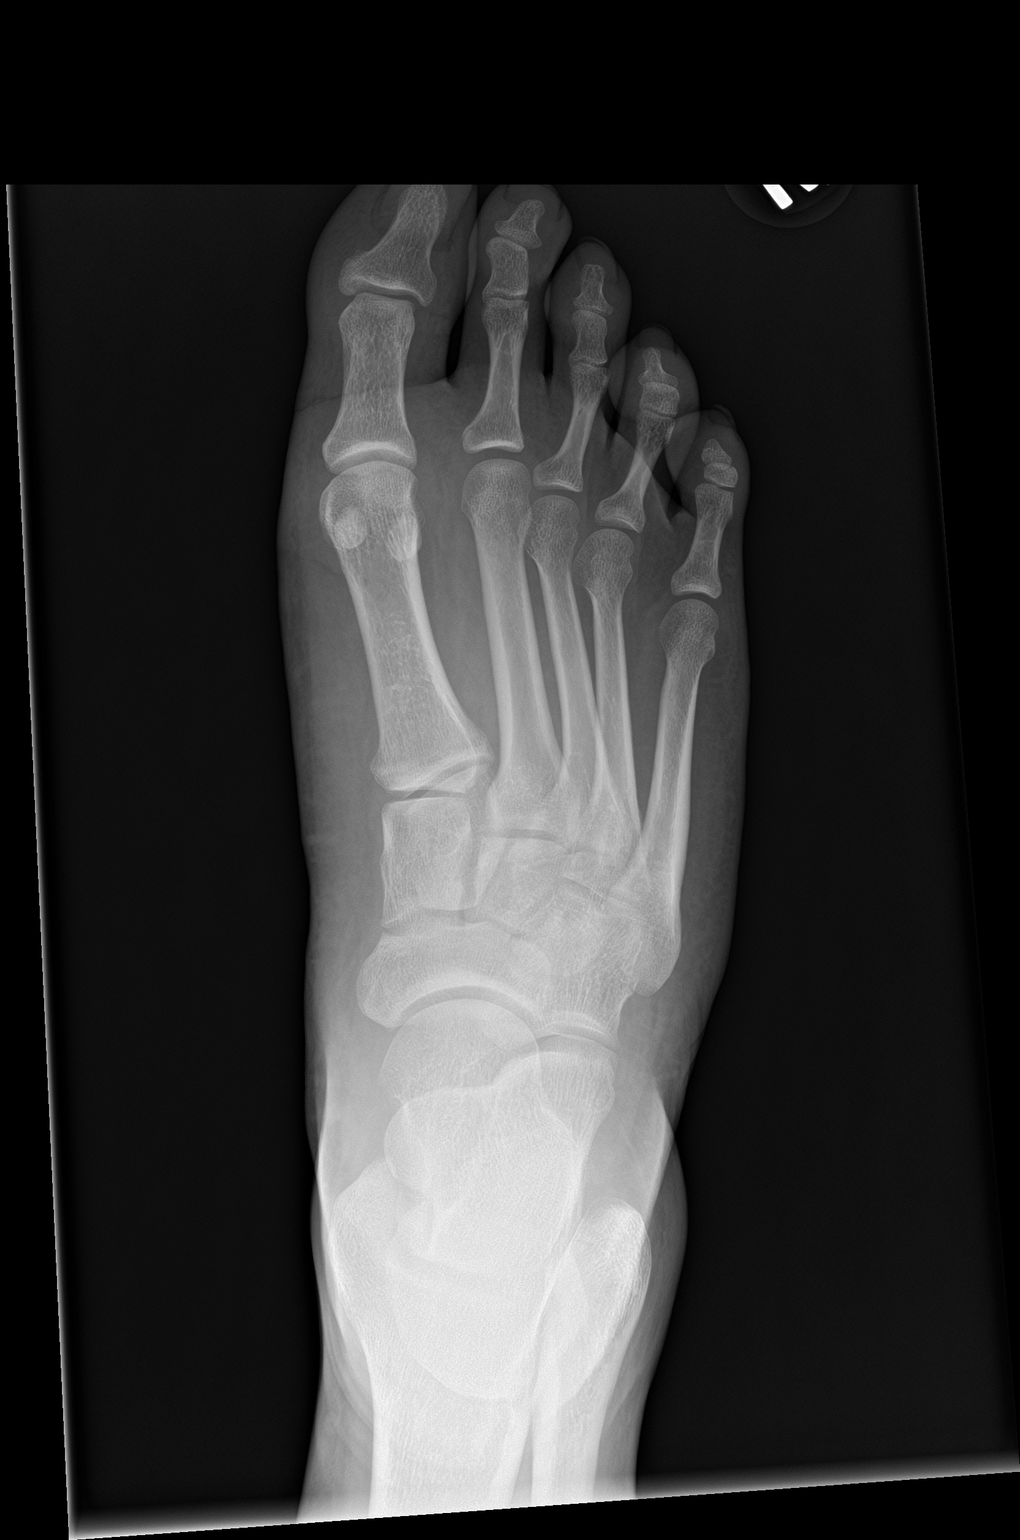
[im 2/3]
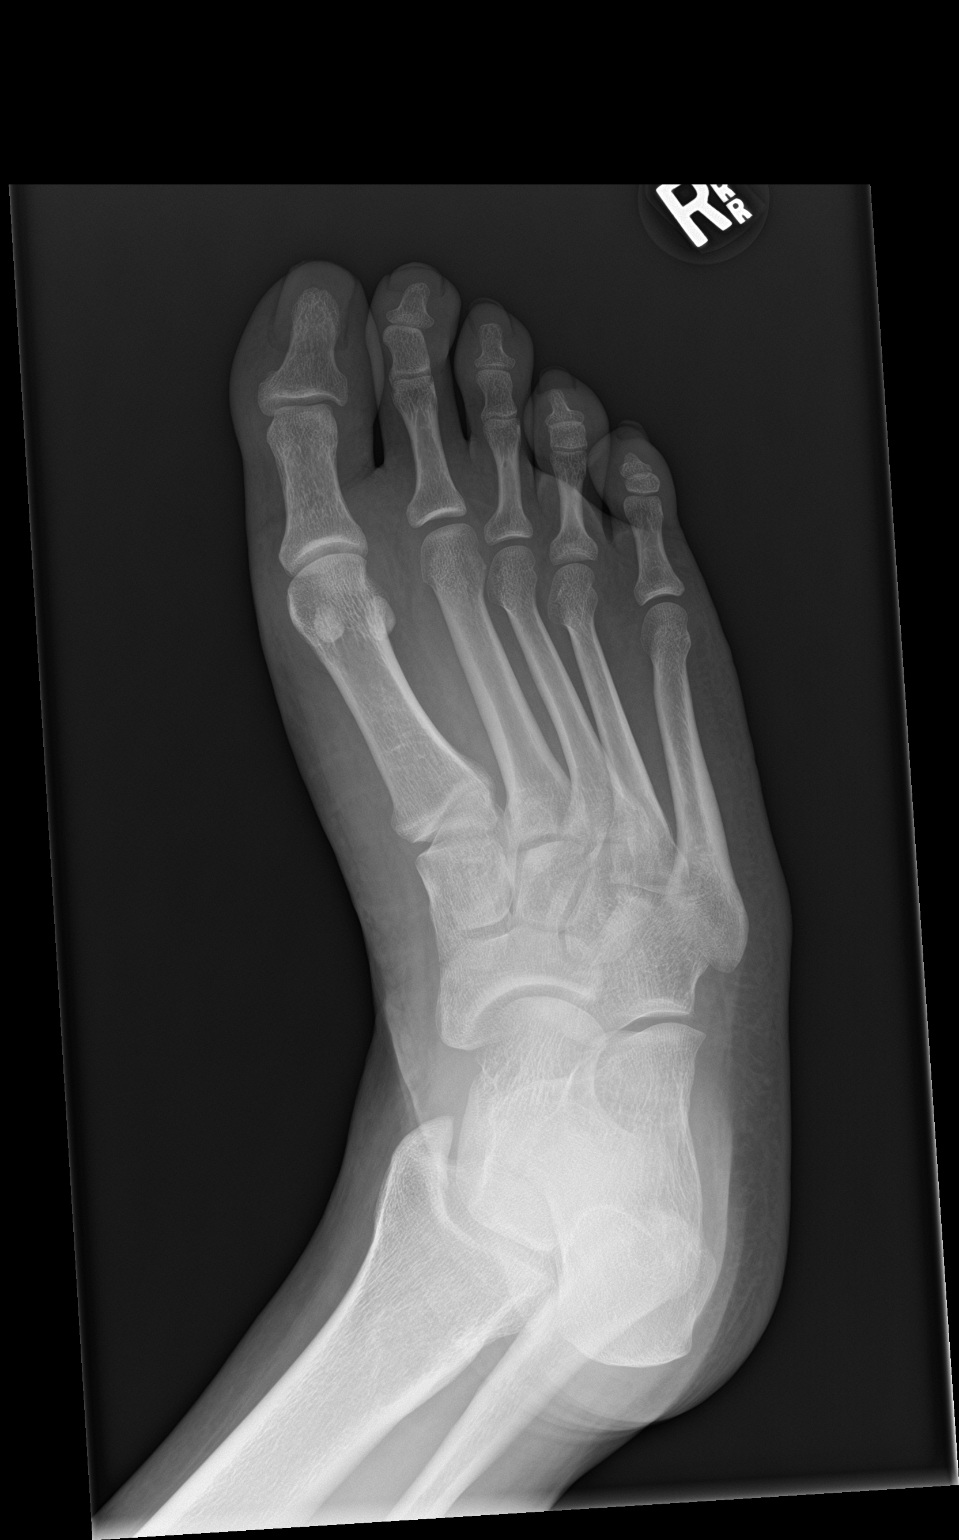
[im 3/3]
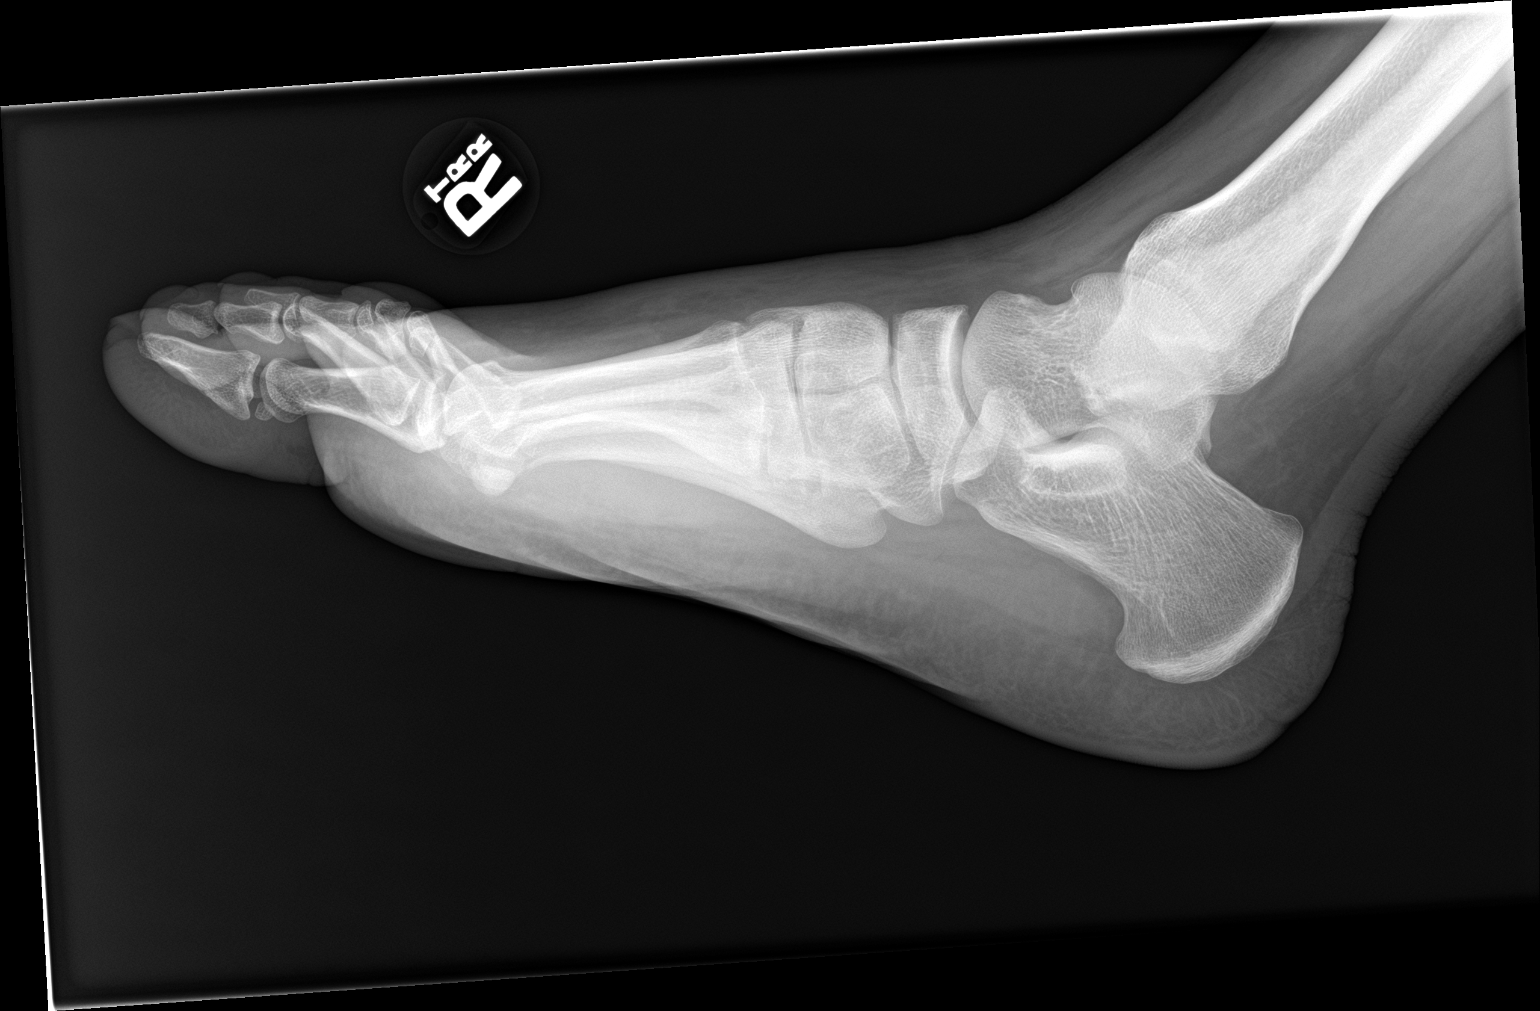

[3 of 3 positions shown; findings below may reference images not displayed]

FINDINGS: There is no evidence of fracture or dislocation. There is no
evidence of arthropathy or other focal bone abnormality. Soft
tissues are unremarkable.
IMPRESSION: Negative.

## 2021-03-28 DIAGNOSIS — I10 Essential (primary) hypertension: Secondary | ICD-10-CM | POA: Diagnosis not present

## 2021-03-28 DIAGNOSIS — M1000 Idiopathic gout, unspecified site: Secondary | ICD-10-CM | POA: Diagnosis not present

## 2021-03-30 DIAGNOSIS — R82998 Other abnormal findings in urine: Secondary | ICD-10-CM | POA: Diagnosis not present

## 2021-04-26 ENCOUNTER — Other Ambulatory Visit: Payer: Self-pay | Admitting: Family Medicine

## 2021-04-26 DIAGNOSIS — I10 Essential (primary) hypertension: Secondary | ICD-10-CM

## 2021-04-26 MED ORDER — AMLODIPINE BESYLATE 2.5 MG PO TABS: 2.5000 mg | ORAL_TABLET | Freq: Every day | ORAL | 0 refills | Status: AC

## 2021-05-24 ENCOUNTER — Encounter: Payer: Self-pay | Admitting: Family Medicine

## 2021-05-24 DIAGNOSIS — M109 Gout, unspecified: Secondary | ICD-10-CM

## 2021-06-15 ENCOUNTER — Ambulatory Visit: Payer: Medicaid Other | Admitting: Family Medicine

## 2021-06-15 ENCOUNTER — Encounter: Payer: Self-pay | Admitting: Family Medicine

## 2021-06-15 ENCOUNTER — Ambulatory Visit: Payer: Self-pay | Admitting: *Deleted

## 2021-06-15 VITALS — BP 110/80 | HR 96 | Temp 97.6°F | Resp 16

## 2021-06-15 DIAGNOSIS — M109 Gout, unspecified: Secondary | ICD-10-CM

## 2021-06-15 DIAGNOSIS — F1910 Other psychoactive substance abuse, uncomplicated: Secondary | ICD-10-CM | POA: Diagnosis not present

## 2021-06-15 DIAGNOSIS — F17209 Nicotine dependence, unspecified, with unspecified nicotine-induced disorders: Secondary | ICD-10-CM

## 2021-06-15 MED ORDER — ALLOPURINOL 100 MG PO TABS: 200.0000 mg | ORAL_TABLET | Freq: Every day | ORAL | 0 refills | Status: AC

## 2021-06-15 MED ORDER — PREDNISONE 10 MG (21) PO TBPK: ORAL_TABLET | ORAL | 0 refills | Status: AC

## 2021-06-15 MED ORDER — COLCHICINE 0.6 MG PO TABS: ORAL_TABLET | ORAL | 2 refills | Status: AC

## 2021-06-15 MED ORDER — ALLOPURINOL 300 MG PO TABS: 300.0000 mg | ORAL_TABLET | Freq: Two times a day (BID) | ORAL | 11 refills | Status: AC

## 2021-06-15 NOTE — Telephone Encounter (Signed)
Reason for Disposition ? [1] SEVERE pain (e.g., excruciating, unable to do any normal activities) AND [2] not improved after 2 hours of pain medicine ? ?Answer Assessment - Initial Assessment Questions ?1. ONSET: "When did the pain start?"  ?    yesterday ?2. LOCATION: "Where is the pain located?"  ?    R foot ?3. PAIN: "How bad is the pain?"    (Scale 1-10; or mild, moderate, severe) ? - MILD (1-3): doesn't interfere with normal activities.  ? - MODERATE (4-7): interferes with normal activities (e.g., work or school) or awakens from sleep, limping.  ? - SEVERE (8-10): excruciating pain, unable to do any normal activities, unable to walk.  ?    severe ?4. WORK OR EXERCISE: "Has there been any recent work or exercise that involved this part of the body?"  ?    *No Answer* ?5. CAUSE: "What do you think is causing the foot pain?" ?    gout ?6. OTHER SYMPTOMS: "Do you have any other symptoms?" (e.g., leg pain, rash, fever, numbness) ?    Slight pain into leg ?7. PREGNANCY: "Is there any chance you are pregnant?" "When was your last menstrual period?" ?    *No Answer* ? ?Protocols used: Foot Pain-A-AH ? ?

## 2021-06-15 NOTE — Telephone Encounter (Signed)
?  Chief Complaint: R foot pain ?Symptoms: foot pain ?Frequency: started yesterday ?Pertinent Negatives: Patient denies fever, rash, numbness ?Disposition: [] ED /[] Urgent Care (no appt availability in office) / [x] Appointment(In office/virtual)/ []  [facility]/ [] Home Care/ [] Refused Recommended Disposition /[] [facility]/ []  Follow-up with PCP ?Additional Notes:    ?

## 2021-06-15 NOTE — Assessment & Plan Note (Addendum)
Chronic, exacerbated ?Believe cause is due to poor diet; denies dehydration ?Has not been titrated up on allopurinol- prev use of 100 mg QD ?No longer taking colchicine; pt unsure why ?Continues to smoke and appears to be under the influence of other medication or substances during visit ?Recommend increase of current allopurinol to 200 mg, reports app 20 tablets at home ?Recommend increase to 300 mg at that point ?Co treat with colchiceine 0.6 day one, 0.3 mg going forward ?Treat with prednisone dose pack as well due to poor attention and asking for things to be repeated ?Encouraged to use OTC 500 mg strength APAP; pt wishes to avoid NSAIDs d/t CKD; however, reported use of Ibuprofen to CMA ?Reviewed CKD labs from specialist 2/23 which revealed normalized GFR and slight increased; however, improved creatinine  ?RTC 1 month for labs; refused today ?

## 2021-06-15 NOTE — Assessment & Plan Note (Signed)
Chronic, did not disclose current use; known previous use ?Patient presented in w/c with partner, 61 month old child ?Poor hygiene with stained/soiled clothing; not wearing footwear on RLE ?Pt request for "strong pain medication"; recommend appropriate diet follow up, use of proper medication, and discussed previous referral- which patient did not show- for steroid injection ? ?

## 2021-06-15 NOTE — Assessment & Plan Note (Signed)
Chronic, stable ?Does not wish to quit ?Discussed use of nicotine products with chronic dz state, gout ?1800QUITNOW recommended ?

## 2021-06-15 NOTE — Progress Notes (Signed)
?  ? ?I,Sulibeya S Dimas,acting as a scribe for Jacky Kindle, FNP.,have documented all relevant documentation on the behalf of Jacky Kindle, FNP,as directed by  Jacky Kindle, FNP while in the presence of Jacky Kindle, FNP. ? ?Established patient visit ? ?Patient: Cody Delgado   DOB: 19-Sep-1985   36 y.o. Male  MRN: [id number] ?Visit Date: 06/15/2021 ? ?Today's healthcare provider: Jacky Kindle, FNP  ?Introduced to Publishing rights manager role and practice setting.  All questions answered.  Discussed provider/patient relationship and expectations. ? ?Chief Complaint  ?Patient presents with  ? Foot Pain  ? ?Subjective  ?  ?HPI  ?Patient here today C/O recurrent gout. Patient reports he has been taking his medications for gout daily. Patient was referred to ortho for a possible cortisone injection as requested by patient on 05/24/21. Patient did not show up to his appt on 05/31/21 at [facility]. Patient reports pain and swelling is all over his right foot patient reports gout flare up x 2 days. Patient reports taking Ibuprofen for pain, reports no pain control.  ? ?Patient request for out of work note d/t pain for 1 week; provided. ? ?Medications: ?Outpatient Medications Prior to Visit  ?Medication Sig  ? allopurinol (ZYLOPRIM) 100 MG tablet Take 1 tablet (100 mg total) by mouth daily.  ? amLODipine (NORVASC) 2.5 MG tablet Take 1 tablet (2.5 mg total) by mouth daily.  ? [DISCONTINUED] colchicine 0.6 MG tablet Take 0.6 mg by mouth daily.  ? [DISCONTINUED] diclofenac Sodium (VOLTAREN) 1 % GEL Apply 2 g topically 4 (four) times daily.  ? [DISCONTINUED] oxyCODONE (OXY IR/ROXICODONE) 5 MG immediate release tablet Take 5 mg by mouth every 8 (eight) hours as needed.  ? [DISCONTINUED] propranolol (INDERAL) 10 MG tablet Take 10 mg by mouth 3 (three) times daily.  ? [DISCONTINUED] sertraline (ZOLOFT) 50 MG tablet Take 50 mg by mouth daily.  ? ?No facility-administered medications prior to visit.  ? ? ?Review of Systems   ?Constitutional:  Negative for fever.  ?Respiratory:  Negative for shortness of breath.   ?Cardiovascular:  Negative for chest pain.  ?Musculoskeletal:  Positive for myalgias.  ?     Right foot pain  ? ?  Objective  ?  ?BP 110/80 (BP Location: Left Arm, Patient Position: Sitting, Cuff Size: Large)   Pulse 96   Temp 97.6 ?F (36.4 ?C) (Oral)   Resp 16   SpO2 99%  ? ?Physical Exam ?Constitutional:   ?   General: He is not in acute distress. ?   Appearance: He is overweight. He is ill-appearing. He is not toxic-appearing.  ?Cardiovascular:  ?   Rate and Rhythm: Normal rate.  ?   Pulses: Normal pulses.  ?   Heart sounds: Normal heart sounds.  ?Pulmonary:  ?   Effort: Pulmonary effort is normal.  ?Musculoskeletal:     ?   General: Tenderness present.  ?   Right foot: Decreased range of motion. Swelling and tenderness present.  ?     Legs: ? ?   Comments: Patient refused to remove sock for exam due to gout pain; slight temperature increase noted to ankle visible beyond top of sock, normal PT pulse. No pitting edema noted. Presented in w/c due to pain.   ?Skin: ?   General: Skin is warm and dry.  ?   Findings: Erythema present.  ?Neurological:  ?   General: No focal deficit present.  ?   Mental Status: He is alert.  ?  Motor: Weakness present.  ?Psychiatric:     ?   Attention and Perception: He is inattentive.     ?   Mood and Affect: Mood is depressed.     ?   Speech: Speech is delayed.     ?   Behavior: Behavior is slowed and withdrawn.     ?   Cognition and Memory: He exhibits impaired recent memory.  ?  ? ?No results found for any visits on 06/15/21. ? Assessment & Plan  ?  ? ?Problem List Items Addressed This Visit   ? ?  ? Musculoskeletal and Integument  ? Acute gout of right foot - Primary  ?  Chronic, exacerbated ?Believe cause is due to poor diet; denies dehydration ?Has not been titrated up on allopurinol- prev use of 100 mg QD ?No longer taking colchicine; pt unsure why ?Continues to smoke and appears to be  under the influence of other medication or substances during visit ?Recommend increase of current allopurinol to 200 mg, reports app 20 tablets at home ?Recommend increase to 300 mg at that point ?Co treat with colchiceine 0.6 day one, 0.3 mg going forward ?Treat with prednisone dose pack as well due to poor attention and asking for things to be repeated ?Encouraged to use OTC 500 mg strength APAP; pt wishes to avoid NSAIDs d/t CKD; however, reported use of Ibuprofen to CMA ?Reviewed CKD labs from specialist 2/23 which revealed normalized GFR and slight increased; however, improved creatinine  ?RTC 1 month for labs; refused today ? ?  ?  ? Relevant Medications  ? predniSONE (STERAPRED UNI-PAK 21 TAB) 10 MG (21) TBPK tablet  ? colchicine 0.6 MG tablet  ? allopurinol (ZYLOPRIM) 300 MG tablet (Start on 06/26/2021)  ? allopurinol (ZYLOPRIM) 100 MG tablet  ?  ? Other  ? Substance abuse (HCC)  ?  Chronic, did not disclose current use; known previous use ?Patient presented in w/c with partner, 22 month old child ?Poor hygiene with stained/soiled clothing; not wearing footwear on RLE ?Pt request for "strong pain medication"; recommend appropriate diet follow up, use of proper medication, and discussed previous referral- which patient did not show- for steroid injection ? ? ?  ?  ? Tobacco use disorder, continuous  ?  Chronic, stable ?Does not wish to quit ?Discussed use of nicotine products with chronic dz state, gout ?1800QUITNOW recommended ? ?  ?  ? ?Return in about 4 weeks (around 07/13/2021), or if symptoms worsen or fail to improve.  ?   ? ?I, Jacky Kindle, FNP, have reviewed all documentation for this visit. The documentation on 06/15/21 for the exam, diagnosis, procedures, and orders are all accurate and complete. ? ?Jacky Kindle, FNP  ?[facility] ?[phone] (phone) ?[phone] (fax) ? ?[facility] ?

## 2021-07-05 ENCOUNTER — Encounter: Payer: Self-pay | Admitting: Family Medicine

## 2021-07-05 DIAGNOSIS — M109 Gout, unspecified: Secondary | ICD-10-CM | POA: Diagnosis not present

## 2021-07-06 ENCOUNTER — Encounter: Payer: Self-pay | Admitting: Family Medicine

## 2021-07-19 ENCOUNTER — Ambulatory Visit: Payer: Medicaid Other | Admitting: Family Medicine

## 2021-07-19 NOTE — Progress Notes (Deleted)
      Established patient visit   Patient: Cody Delgado   DOB: [birth year]   36 y.o. Male  MRN: [id number] Visit Date: 07/19/2021  Today's healthcare provider: Jacky Kindle, FNP   I,Kenston Longton J Mindel Friscia,acting as a scribe for Jacky Kindle, FNP.,have documented all relevant documentation on the behalf of Jacky Kindle, FNP,as directed by  Jacky Kindle, FNP while in the presence of Jacky Kindle, FNP.   No chief complaint on file.  Subjective    HPI  Follow up for Gout  The patient was last seen for this 1 month ago. Changes made at last visit include continue medication.  He reports {excellent/good/fair/poor:19665} compliance with treatment. He feels that condition is {improved/worse/unchanged:3041574}. He {is/is not:21021397} having side effects. ***  -----------------------------------------------------------------------------------------   Medications: Outpatient Medications Prior to Visit  Medication Sig   allopurinol (ZYLOPRIM) 100 MG tablet Take 1 tablet (100 mg total) by mouth daily.   allopurinol (ZYLOPRIM) 100 MG tablet Take 2 tablets (200 mg total) by mouth daily.   allopurinol (ZYLOPRIM) 300 MG tablet Take 1 tablet (300 mg total) by mouth 2 (two) times daily.   amLODipine (NORVASC) 2.5 MG tablet Take 1 tablet (2.5 mg total) by mouth daily.   colchicine 0.6 MG tablet Take 1 tablet on daily one; take 1/2 tablet going forward day 2-30.   predniSONE (STERAPRED UNI-PAK 21 TAB) 10 MG (21) TBPK tablet Take with morning meal. Taper as recommended on package.   No facility-administered medications prior to visit.    Review of Systems  {Labs  Heme  Chem  Endocrine  Serology  Results Review (optional):23779}   Objective    There were no vitals taken for this visit. {Show previous vital signs (optional):23777}  Physical Exam  ***  No results found for any visits on 07/19/21.  Assessment & Plan     ***  No follow-ups on file.      {provider  attestation***:1}   Jacky Kindle, FNP  [facility] [phone] (phone) [phone] (fax)  [facility]

## 2021-08-03 DIAGNOSIS — F314 Bipolar disorder, current episode depressed, severe, without psychotic features: Secondary | ICD-10-CM | POA: Diagnosis not present

## 2021-08-15 DIAGNOSIS — F331 Major depressive disorder, recurrent, moderate: Secondary | ICD-10-CM | POA: Diagnosis not present

## 2021-08-15 DIAGNOSIS — F410 Panic disorder [episodic paroxysmal anxiety] without agoraphobia: Secondary | ICD-10-CM | POA: Diagnosis not present

## 2021-08-15 DIAGNOSIS — F411 Generalized anxiety disorder: Secondary | ICD-10-CM | POA: Diagnosis not present

## 2022-02-01 DIAGNOSIS — F314 Bipolar disorder, current episode depressed, severe, without psychotic features: Secondary | ICD-10-CM | POA: Diagnosis not present

## 2022-02-01 DIAGNOSIS — F4001 Agoraphobia with panic disorder: Secondary | ICD-10-CM | POA: Diagnosis not present

## 2022-02-01 DIAGNOSIS — F411 Generalized anxiety disorder: Secondary | ICD-10-CM | POA: Diagnosis not present

## 2022-03-03 DIAGNOSIS — F411 Generalized anxiety disorder: Secondary | ICD-10-CM | POA: Diagnosis not present

## 2022-03-03 DIAGNOSIS — F4001 Agoraphobia with panic disorder: Secondary | ICD-10-CM | POA: Diagnosis not present

## 2022-03-03 DIAGNOSIS — F314 Bipolar disorder, current episode depressed, severe, without psychotic features: Secondary | ICD-10-CM | POA: Diagnosis not present

## 2022-04-24 DIAGNOSIS — F411 Generalized anxiety disorder: Secondary | ICD-10-CM | POA: Diagnosis not present

## 2022-04-24 DIAGNOSIS — F4311 Post-traumatic stress disorder, acute: Secondary | ICD-10-CM | POA: Diagnosis not present

## 2022-05-01 DIAGNOSIS — F4310 Post-traumatic stress disorder, unspecified: Secondary | ICD-10-CM | POA: Diagnosis not present

## 2022-05-08 DIAGNOSIS — F4311 Post-traumatic stress disorder, acute: Secondary | ICD-10-CM | POA: Diagnosis not present

## 2022-05-09 DIAGNOSIS — F314 Bipolar disorder, current episode depressed, severe, without psychotic features: Secondary | ICD-10-CM | POA: Diagnosis not present

## 2022-05-09 DIAGNOSIS — F4001 Agoraphobia with panic disorder: Secondary | ICD-10-CM | POA: Diagnosis not present

## 2022-05-09 DIAGNOSIS — F411 Generalized anxiety disorder: Secondary | ICD-10-CM | POA: Diagnosis not present

## 2022-05-15 DIAGNOSIS — F4310 Post-traumatic stress disorder, unspecified: Secondary | ICD-10-CM | POA: Diagnosis not present

## 2022-05-22 DIAGNOSIS — F4310 Post-traumatic stress disorder, unspecified: Secondary | ICD-10-CM | POA: Diagnosis not present

## 2022-05-29 DIAGNOSIS — F4310 Post-traumatic stress disorder, unspecified: Secondary | ICD-10-CM | POA: Diagnosis not present

## 2022-06-05 DIAGNOSIS — F4310 Post-traumatic stress disorder, unspecified: Secondary | ICD-10-CM | POA: Diagnosis not present

## 2022-06-07 DIAGNOSIS — F411 Generalized anxiety disorder: Secondary | ICD-10-CM | POA: Diagnosis not present

## 2022-06-07 DIAGNOSIS — F314 Bipolar disorder, current episode depressed, severe, without psychotic features: Secondary | ICD-10-CM | POA: Diagnosis not present

## 2022-06-07 DIAGNOSIS — F4001 Agoraphobia with panic disorder: Secondary | ICD-10-CM | POA: Diagnosis not present

## 2022-06-12 DIAGNOSIS — F4310 Post-traumatic stress disorder, unspecified: Secondary | ICD-10-CM | POA: Diagnosis not present

## 2022-06-21 DIAGNOSIS — F4310 Post-traumatic stress disorder, unspecified: Secondary | ICD-10-CM | POA: Diagnosis not present

## 2022-06-28 DIAGNOSIS — F4310 Post-traumatic stress disorder, unspecified: Secondary | ICD-10-CM | POA: Diagnosis not present

## 2022-07-21 DIAGNOSIS — F4310 Post-traumatic stress disorder, unspecified: Secondary | ICD-10-CM | POA: Diagnosis not present

## 2022-07-25 DIAGNOSIS — F411 Generalized anxiety disorder: Secondary | ICD-10-CM | POA: Diagnosis not present

## 2022-07-25 DIAGNOSIS — F4001 Agoraphobia with panic disorder: Secondary | ICD-10-CM | POA: Diagnosis not present

## 2022-07-25 DIAGNOSIS — F314 Bipolar disorder, current episode depressed, severe, without psychotic features: Secondary | ICD-10-CM | POA: Diagnosis not present

## 2022-08-15 DIAGNOSIS — F411 Generalized anxiety disorder: Secondary | ICD-10-CM | POA: Diagnosis not present

## 2022-08-15 DIAGNOSIS — F4001 Agoraphobia with panic disorder: Secondary | ICD-10-CM | POA: Diagnosis not present

## 2022-08-15 DIAGNOSIS — F314 Bipolar disorder, current episode depressed, severe, without psychotic features: Secondary | ICD-10-CM | POA: Diagnosis not present

## 2022-08-30 DIAGNOSIS — F331 Major depressive disorder, recurrent, moderate: Secondary | ICD-10-CM | POA: Diagnosis not present

## 2022-09-05 DIAGNOSIS — M5416 Radiculopathy, lumbar region: Secondary | ICD-10-CM | POA: Diagnosis not present

## 2022-09-08 IMAGING — US US RENAL
1 series · 15 of 25 positions shown · non-contrast
Comparison: None available.

CLINICAL DATA: Initial evaluation for stage 3 chronic kidney
disease.

EXAM:
RENAL / URINARY TRACT ULTRASOUND COMPLETE

[Series 1: us renal · 15 of 30 slices shown]
[im 1/30]
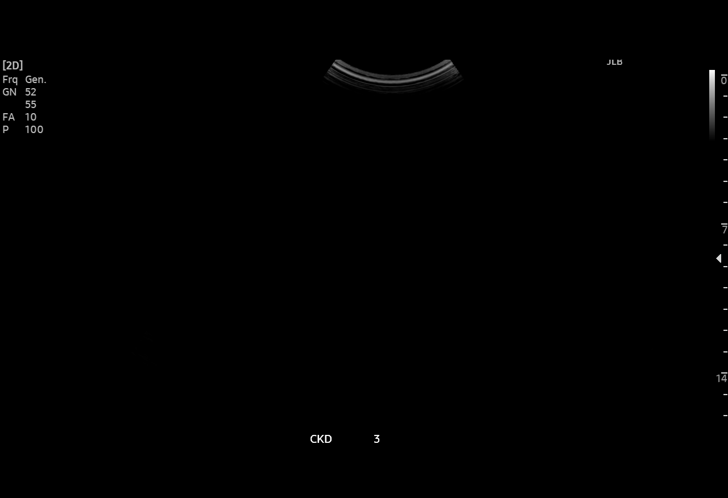
[im 3/30]
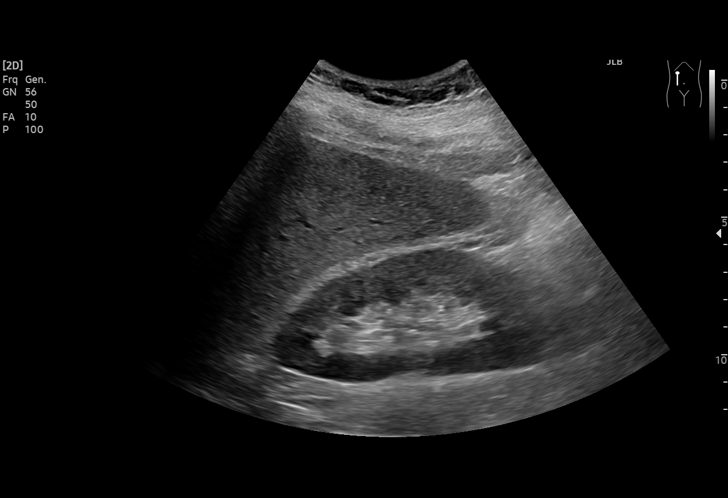
[im 5/30]
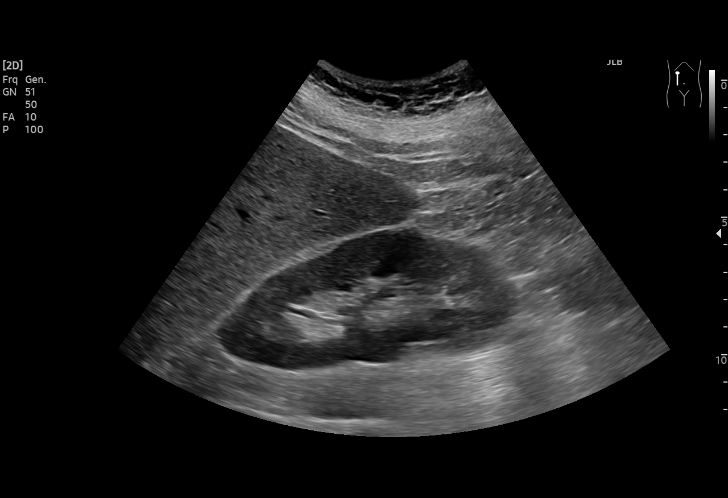
[im 7/30]
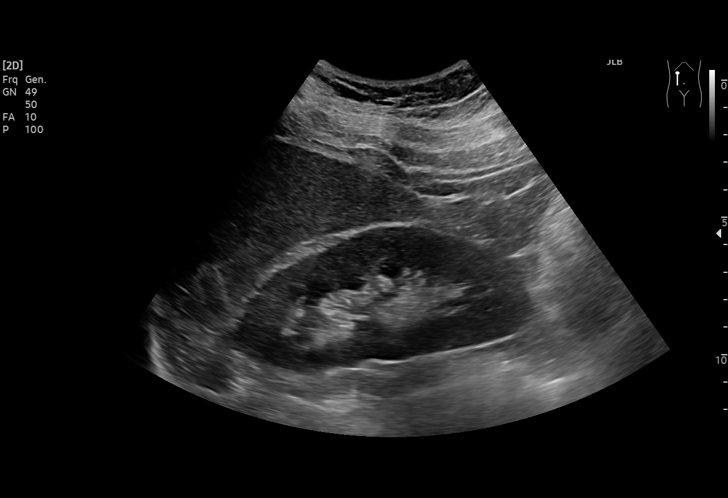
[im 9/30]
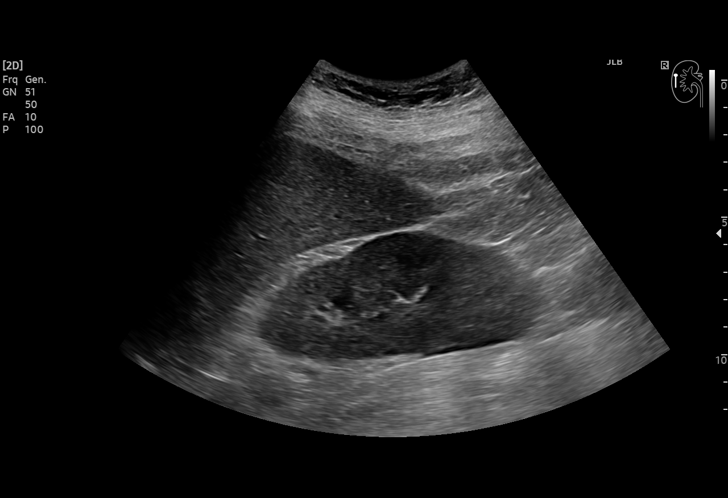
[im 11/30]
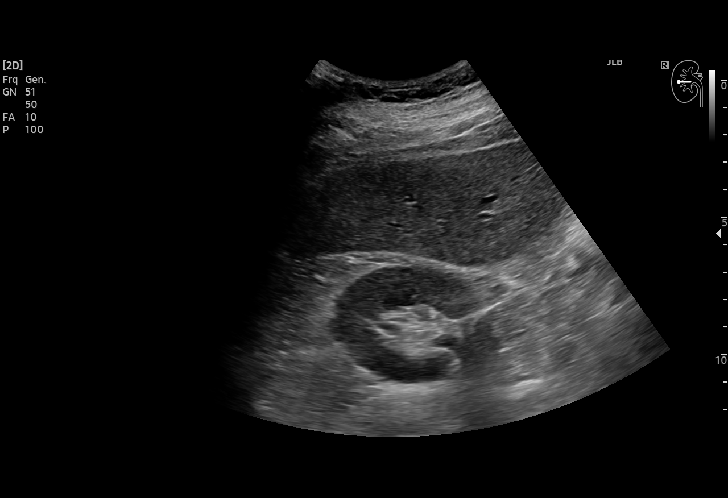
[im 13/30]
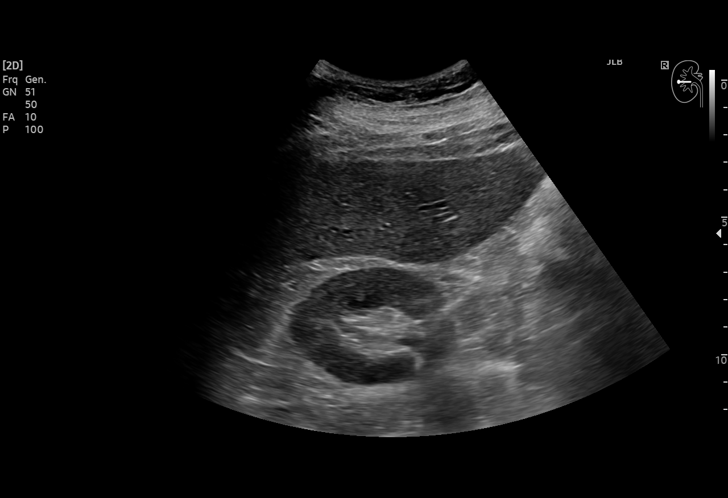
[im 15/30]
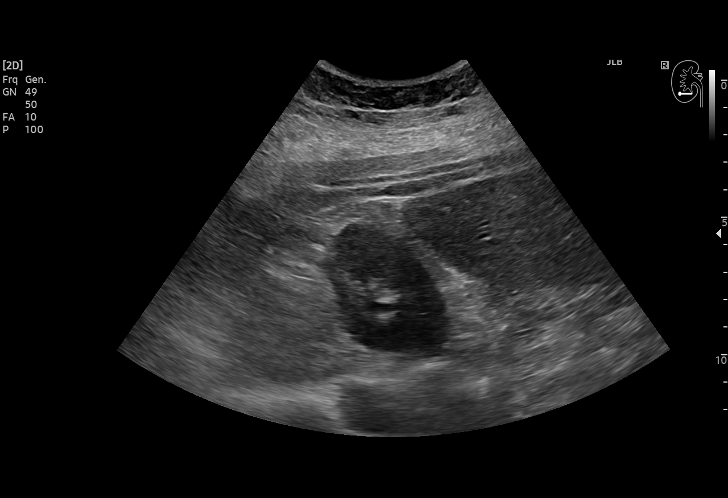
[im 17/30]
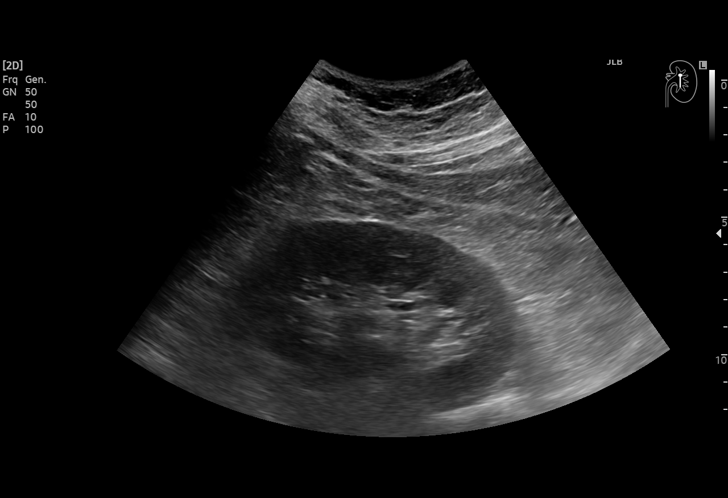
[im 19/30]
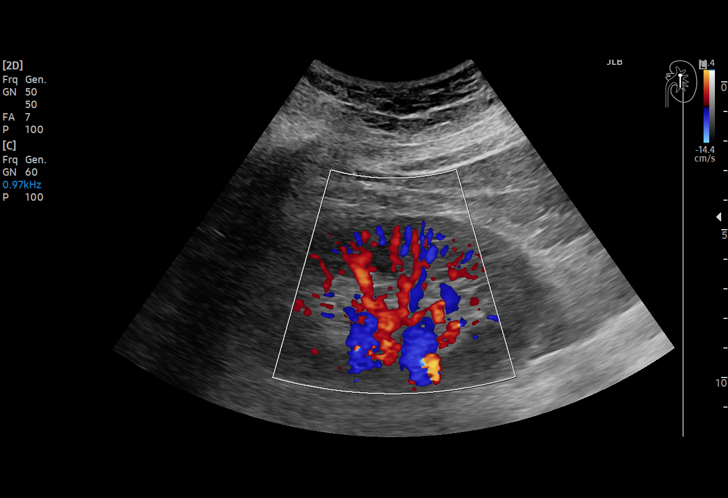
[im 21/30]
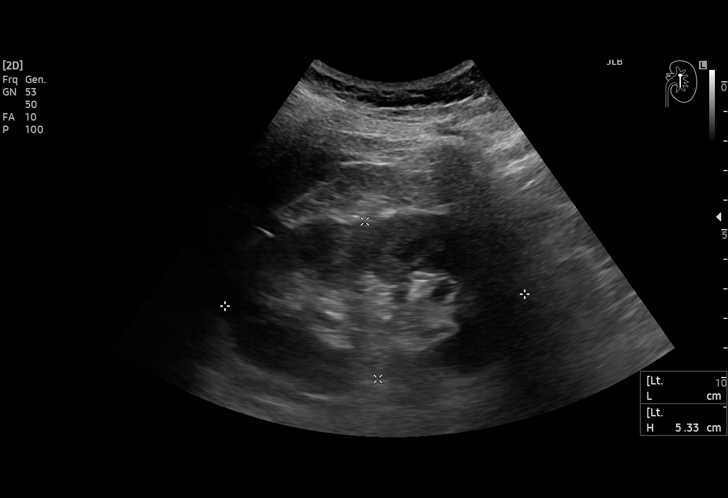
[im 23/30]
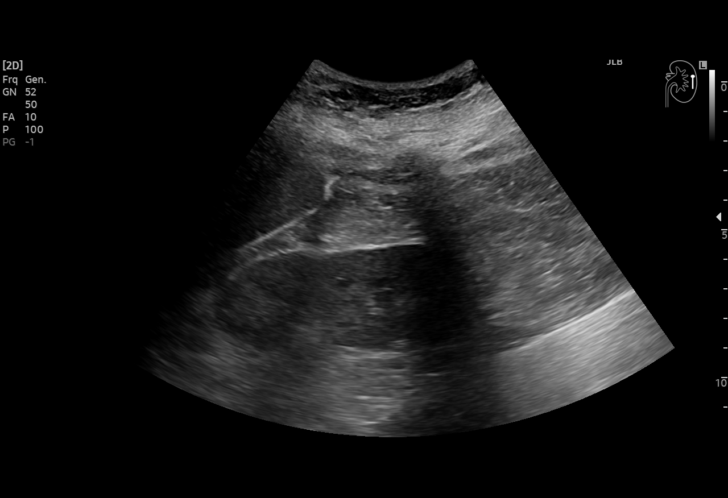
[im 25/30]
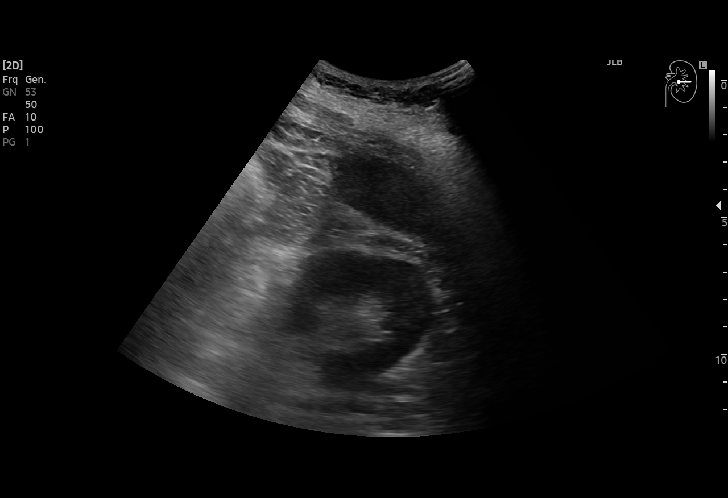
[im 27/30]
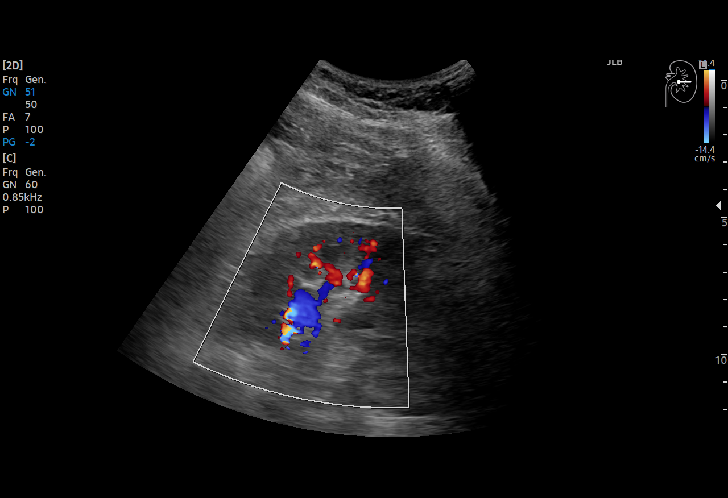
[im 30/30]
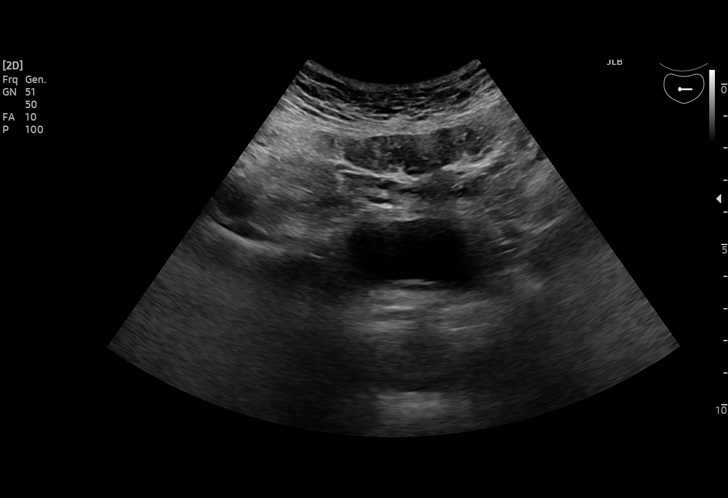

[15 of 25 positions shown; findings below may reference images not displayed]

FINDINGS: Right Kidney:

Renal measurements: 11.1 x 4.7 x 4.9 cm = volume: 132.8 mL. Renal
echogenicity within normal limits. No nephrolithiasis or
hydronephrosis. No focal renal mass.

Left Kidney:

Renal measurements: 10.1 x 5.3 x 4.8 cm = volume: 135.4 mL. Renal
echogenicity within normal limits. No nephrolithiasis or
hydronephrosis. No focal renal mass.

Bladder:

Appears normal for degree of bladder distention.

Other:

None.
IMPRESSION: Normal renal ultrasound. No hydronephrosis or other significant
finding.

## 2022-09-12 DIAGNOSIS — F4001 Agoraphobia with panic disorder: Secondary | ICD-10-CM | POA: Diagnosis not present

## 2022-09-12 DIAGNOSIS — F314 Bipolar disorder, current episode depressed, severe, without psychotic features: Secondary | ICD-10-CM | POA: Diagnosis not present

## 2022-09-12 DIAGNOSIS — F411 Generalized anxiety disorder: Secondary | ICD-10-CM | POA: Diagnosis not present

## 2022-09-15 DIAGNOSIS — F4311 Post-traumatic stress disorder, acute: Secondary | ICD-10-CM | POA: Diagnosis not present

## 2022-09-16 DIAGNOSIS — F4311 Post-traumatic stress disorder, acute: Secondary | ICD-10-CM | POA: Diagnosis not present

## 2022-09-21 DIAGNOSIS — F4311 Post-traumatic stress disorder, acute: Secondary | ICD-10-CM | POA: Diagnosis not present

## 2022-09-22 DIAGNOSIS — F4311 Post-traumatic stress disorder, acute: Secondary | ICD-10-CM | POA: Diagnosis not present

## 2022-09-28 DIAGNOSIS — F4311 Post-traumatic stress disorder, acute: Secondary | ICD-10-CM | POA: Diagnosis not present

## 2022-09-29 DIAGNOSIS — F4311 Post-traumatic stress disorder, acute: Secondary | ICD-10-CM | POA: Diagnosis not present

## 2022-10-04 DIAGNOSIS — F4311 Post-traumatic stress disorder, acute: Secondary | ICD-10-CM | POA: Diagnosis not present

## 2022-10-05 DIAGNOSIS — M25461 Effusion, right knee: Secondary | ICD-10-CM | POA: Diagnosis not present

## 2022-10-05 DIAGNOSIS — M25561 Pain in right knee: Secondary | ICD-10-CM | POA: Diagnosis not present

## 2022-10-05 DIAGNOSIS — F4311 Post-traumatic stress disorder, acute: Secondary | ICD-10-CM | POA: Diagnosis not present

## 2022-10-11 DIAGNOSIS — F314 Bipolar disorder, current episode depressed, severe, without psychotic features: Secondary | ICD-10-CM | POA: Diagnosis not present

## 2022-10-11 DIAGNOSIS — F4001 Agoraphobia with panic disorder: Secondary | ICD-10-CM | POA: Diagnosis not present

## 2022-10-11 DIAGNOSIS — F411 Generalized anxiety disorder: Secondary | ICD-10-CM | POA: Diagnosis not present

## 2022-10-13 DIAGNOSIS — F4311 Post-traumatic stress disorder, acute: Secondary | ICD-10-CM | POA: Diagnosis not present

## 2022-10-14 DIAGNOSIS — F4311 Post-traumatic stress disorder, acute: Secondary | ICD-10-CM | POA: Diagnosis not present

## 2022-10-20 DIAGNOSIS — F4311 Post-traumatic stress disorder, acute: Secondary | ICD-10-CM | POA: Diagnosis not present

## 2022-10-21 DIAGNOSIS — F4311 Post-traumatic stress disorder, acute: Secondary | ICD-10-CM | POA: Diagnosis not present

## 2022-10-27 DIAGNOSIS — F4311 Post-traumatic stress disorder, acute: Secondary | ICD-10-CM | POA: Diagnosis not present

## 2022-10-28 DIAGNOSIS — F4311 Post-traumatic stress disorder, acute: Secondary | ICD-10-CM | POA: Diagnosis not present

## 2022-11-02 DIAGNOSIS — F4311 Post-traumatic stress disorder, acute: Secondary | ICD-10-CM | POA: Diagnosis not present

## 2022-11-03 DIAGNOSIS — F4311 Post-traumatic stress disorder, acute: Secondary | ICD-10-CM | POA: Diagnosis not present

## 2022-11-10 DIAGNOSIS — F4311 Post-traumatic stress disorder, acute: Secondary | ICD-10-CM | POA: Diagnosis not present

## 2022-11-17 DIAGNOSIS — F4311 Post-traumatic stress disorder, acute: Secondary | ICD-10-CM | POA: Diagnosis not present

## 2022-11-18 DIAGNOSIS — F4311 Post-traumatic stress disorder, acute: Secondary | ICD-10-CM | POA: Diagnosis not present

## 2022-11-27 DIAGNOSIS — F4311 Post-traumatic stress disorder, acute: Secondary | ICD-10-CM | POA: Diagnosis not present

## 2022-11-28 DIAGNOSIS — F4311 Post-traumatic stress disorder, acute: Secondary | ICD-10-CM | POA: Diagnosis not present

## 2022-12-06 DIAGNOSIS — F4311 Post-traumatic stress disorder, acute: Secondary | ICD-10-CM | POA: Diagnosis not present

## 2022-12-07 DIAGNOSIS — F4311 Post-traumatic stress disorder, acute: Secondary | ICD-10-CM | POA: Diagnosis not present

## 2022-12-11 DIAGNOSIS — F4311 Post-traumatic stress disorder, acute: Secondary | ICD-10-CM | POA: Diagnosis not present

## 2022-12-11 DIAGNOSIS — F4001 Agoraphobia with panic disorder: Secondary | ICD-10-CM | POA: Diagnosis not present

## 2022-12-11 DIAGNOSIS — F314 Bipolar disorder, current episode depressed, severe, without psychotic features: Secondary | ICD-10-CM | POA: Diagnosis not present

## 2022-12-11 DIAGNOSIS — F411 Generalized anxiety disorder: Secondary | ICD-10-CM | POA: Diagnosis not present

## 2022-12-12 DIAGNOSIS — F4311 Post-traumatic stress disorder, acute: Secondary | ICD-10-CM | POA: Diagnosis not present

## 2022-12-13 DIAGNOSIS — F4311 Post-traumatic stress disorder, acute: Secondary | ICD-10-CM | POA: Diagnosis not present

## 2023-01-07 DIAGNOSIS — F4311 Post-traumatic stress disorder, acute: Secondary | ICD-10-CM | POA: Diagnosis not present

## 2023-03-27 IMAGING — CR DG KNEE COMPLETE 4+V*R*
1 series · 4 of 4 positions shown · non-contrast
Comparison: None.

CLINICAL DATA: Intermittent swelling and right knee pain. No known
injury.

EXAM:
RIGHT KNEE - COMPLETE 4+ VIEW

[Series 1: dg knee complete 4 views right · 0.14mm/px · 4 of 4 slices shown]
[im 1/4]
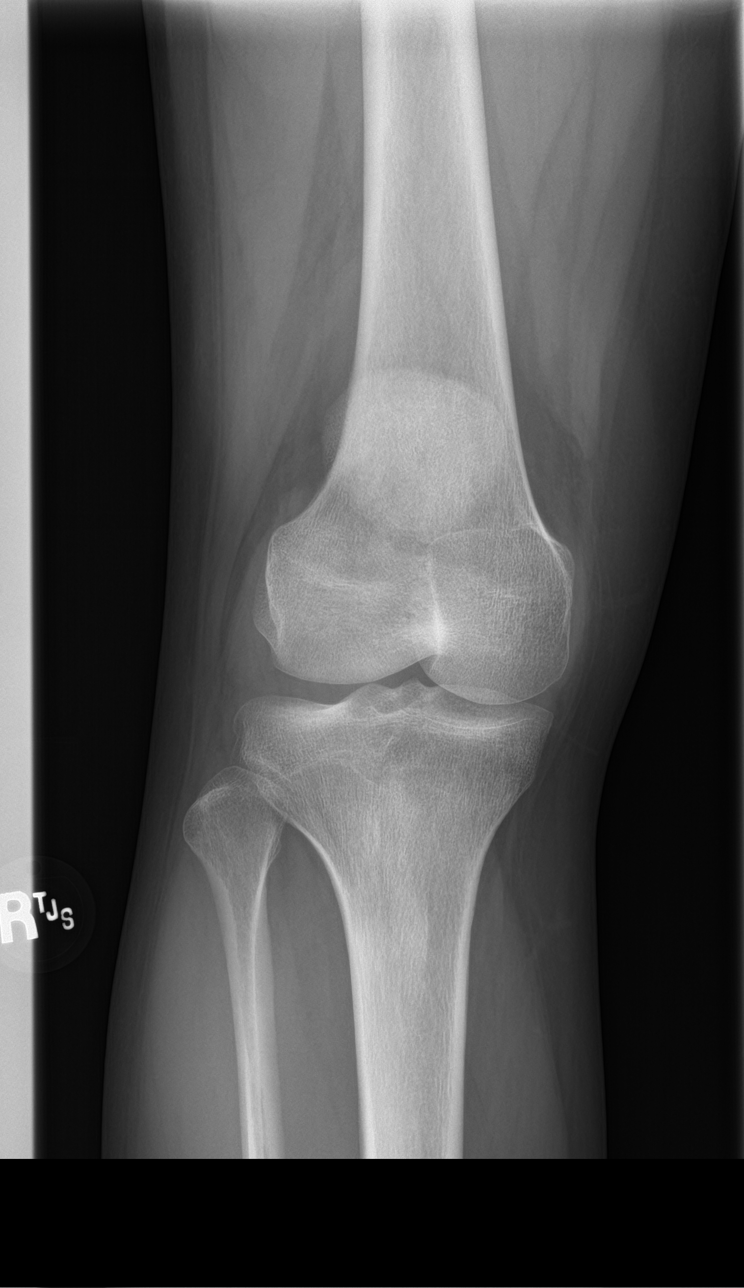
[im 2/4]
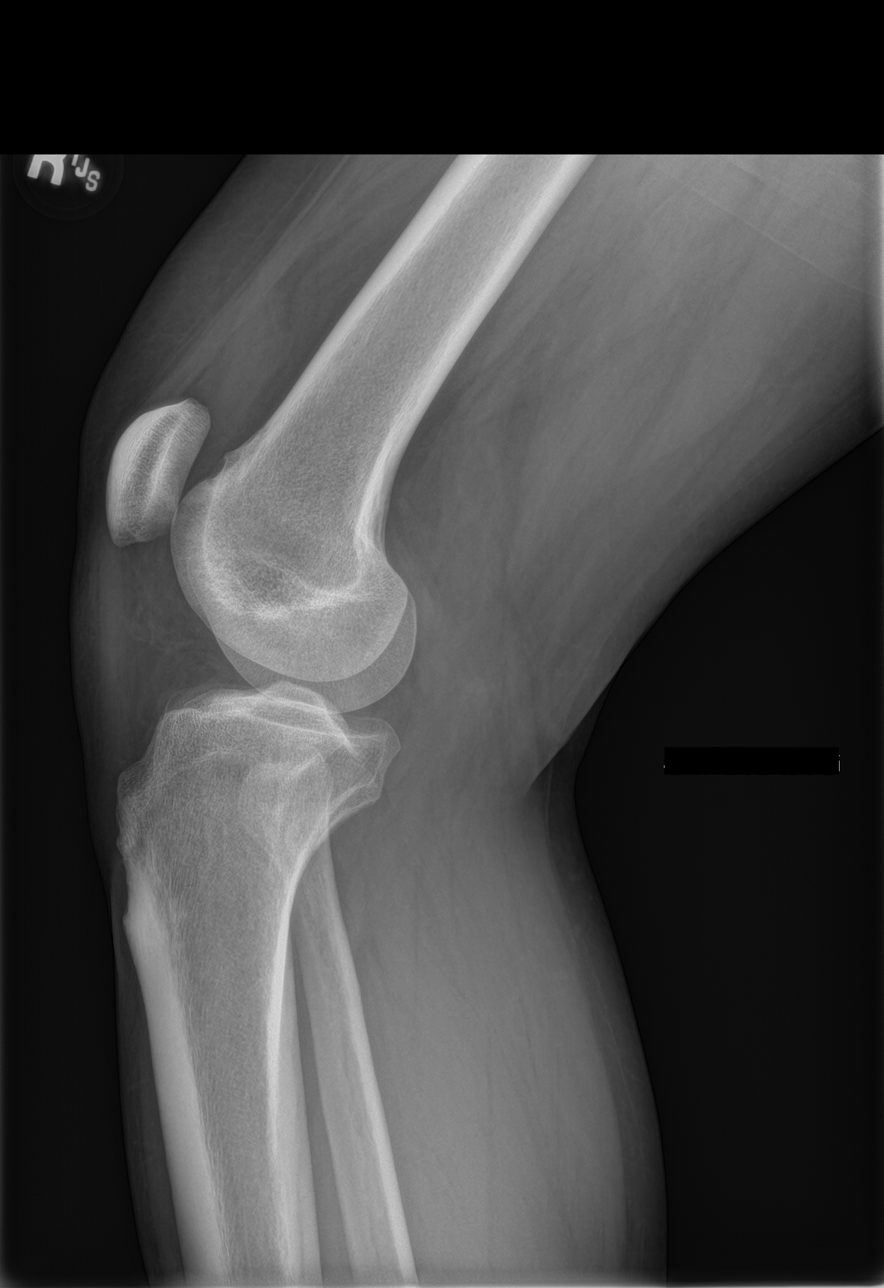
[im 3/4]
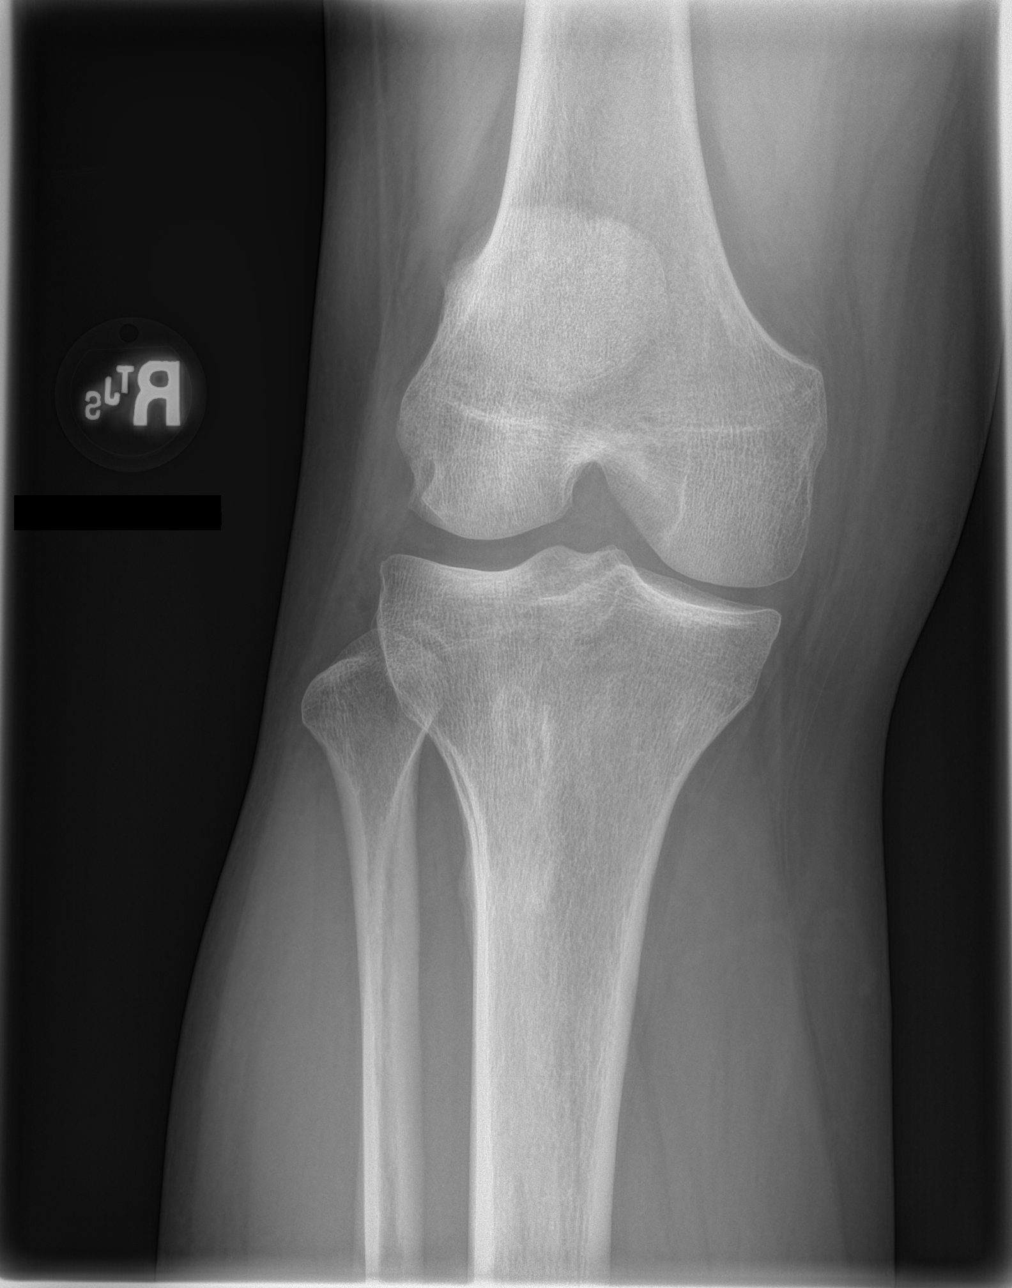
[im 4/4]
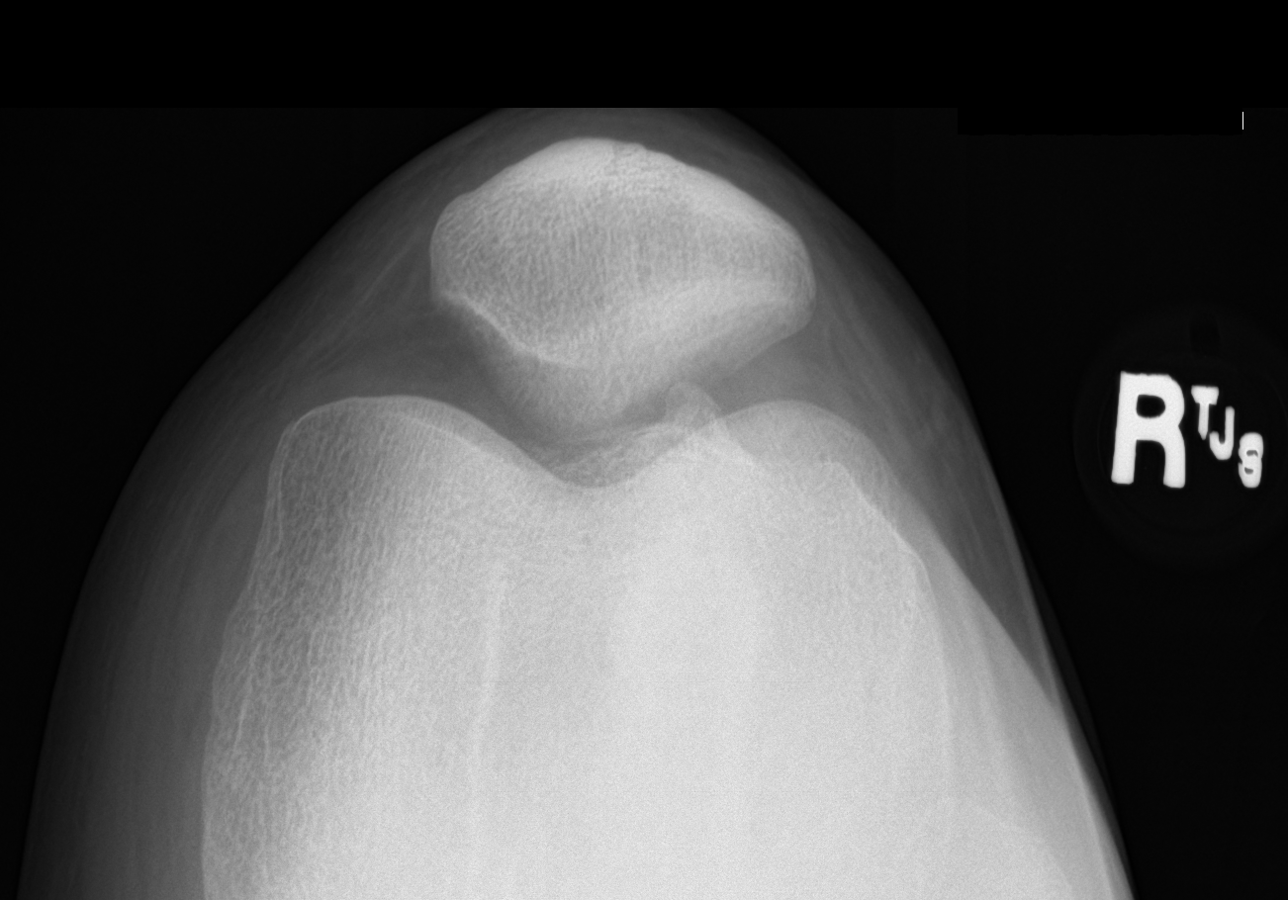

[4 of 4 positions shown; findings below may reference images not displayed]

FINDINGS: Imaging obtained standing. Normal joint spaces and alignment. No
fracture, erosion, or significant osteophytes. There is a moderate
knee joint effusion. No focal bone lesion or bone destruction.
IMPRESSION: Moderate knee joint effusion.  No osseous abnormalities.
